# Patient Record
Sex: Male | Born: 1945 | Race: White | Hispanic: No | Smoking: Former smoker
Health system: Southern US, Community
[De-identification: ages and names within clinical notes are randomized; demographics above are authoritative.]

## PROBLEM LIST (undated history)

## (undated) DIAGNOSIS — K219 Gastro-esophageal reflux disease without esophagitis: Secondary | ICD-10-CM

## (undated) DIAGNOSIS — E785 Hyperlipidemia, unspecified: Secondary | ICD-10-CM

## (undated) DIAGNOSIS — I10 Essential (primary) hypertension: Secondary | ICD-10-CM

## (undated) DIAGNOSIS — E119 Type 2 diabetes mellitus without complications: Secondary | ICD-10-CM

## (undated) DIAGNOSIS — G4730 Sleep apnea, unspecified: Secondary | ICD-10-CM

## (undated) DIAGNOSIS — I639 Cerebral infarction, unspecified: Secondary | ICD-10-CM

## (undated) DIAGNOSIS — K5730 Diverticulosis of large intestine without perforation or abscess without bleeding: Secondary | ICD-10-CM

## (undated) DIAGNOSIS — K449 Diaphragmatic hernia without obstruction or gangrene: Secondary | ICD-10-CM

## (undated) DIAGNOSIS — D369 Benign neoplasm, unspecified site: Secondary | ICD-10-CM

## (undated) DIAGNOSIS — K649 Unspecified hemorrhoids: Secondary | ICD-10-CM

## (undated) HISTORY — DX: Diverticulosis of large intestine without perforation or abscess without bleeding: K57.30

## (undated) HISTORY — DX: Gastro-esophageal reflux disease without esophagitis: K21.9

## (undated) HISTORY — PX: EXPLORATORY LAPAROTOMY: SUR591

## (undated) HISTORY — DX: Diaphragmatic hernia without obstruction or gangrene: K44.9

## (undated) HISTORY — DX: Unspecified hemorrhoids: K64.9

## (undated) HISTORY — DX: Benign neoplasm, unspecified site: D36.9

## (undated) HISTORY — DX: Cerebral infarction, unspecified: I63.9

## (undated) HISTORY — DX: Hyperlipidemia, unspecified: E78.5

---

## 1976-06-06 HISTORY — PX: SPLENECTOMY, TOTAL: SHX788

## 1988-06-06 HISTORY — PX: CHOLECYSTECTOMY: SHX55

## 1996-06-06 DIAGNOSIS — G459 Transient cerebral ischemic attack, unspecified: Secondary | ICD-10-CM

## 1996-06-06 DIAGNOSIS — I639 Cerebral infarction, unspecified: Secondary | ICD-10-CM

## 1996-06-06 HISTORY — DX: Transient cerebral ischemic attack, unspecified: G45.9

## 1996-06-06 HISTORY — DX: Cerebral infarction, unspecified: I63.9

## 1997-10-11 ENCOUNTER — Observation Stay (HOSPITAL_COMMUNITY): Admission: EM | Admit: 1997-10-11 | Discharge: 1997-10-12 | Payer: Self-pay | Admitting: Emergency Medicine

## 2000-06-06 DIAGNOSIS — K449 Diaphragmatic hernia without obstruction or gangrene: Secondary | ICD-10-CM

## 2000-06-06 DIAGNOSIS — K5730 Diverticulosis of large intestine without perforation or abscess without bleeding: Secondary | ICD-10-CM

## 2000-06-06 DIAGNOSIS — D369 Benign neoplasm, unspecified site: Secondary | ICD-10-CM

## 2000-06-06 DIAGNOSIS — K649 Unspecified hemorrhoids: Secondary | ICD-10-CM

## 2000-06-06 HISTORY — DX: Diaphragmatic hernia without obstruction or gangrene: K44.9

## 2000-06-06 HISTORY — DX: Diverticulosis of large intestine without perforation or abscess without bleeding: K57.30

## 2000-06-06 HISTORY — DX: Benign neoplasm, unspecified site: D36.9

## 2000-06-06 HISTORY — DX: Unspecified hemorrhoids: K64.9

## 2001-01-09 ENCOUNTER — Encounter: Admission: RE | Admit: 2001-01-09 | Discharge: 2001-01-09 | Payer: Self-pay | Admitting: Gastroenterology

## 2001-01-09 ENCOUNTER — Encounter: Payer: Self-pay | Admitting: Gastroenterology

## 2001-01-23 ENCOUNTER — Ambulatory Visit (HOSPITAL_COMMUNITY): Admission: RE | Admit: 2001-01-23 | Discharge: 2001-01-23 | Payer: Self-pay | Admitting: Gastroenterology

## 2001-01-23 ENCOUNTER — Encounter (INDEPENDENT_AMBULATORY_CARE_PROVIDER_SITE_OTHER): Payer: Self-pay | Admitting: Specialist

## 2001-01-23 HISTORY — PX: ESOPHAGOGASTRODUODENOSCOPY: SHX1529

## 2001-01-23 HISTORY — PX: COLONOSCOPY: SHX174

## 2009-01-03 ENCOUNTER — Emergency Department (HOSPITAL_COMMUNITY): Admission: EM | Admit: 2009-01-03 | Discharge: 2009-01-03 | Payer: Self-pay | Admitting: Emergency Medicine

## 2009-01-26 ENCOUNTER — Ambulatory Visit (HOSPITAL_COMMUNITY): Admission: RE | Admit: 2009-01-26 | Discharge: 2009-01-26 | Payer: Self-pay | Admitting: Family Medicine

## 2010-10-22 NOTE — Procedures (Signed)
[place]. [facility]  Patient:    Andre [name], Andre [name] Visit Number: [id number] MRN: [id number]          Service Type: END Location: ENDO Attending Physician:  Nelda Marseille Proc. Date: 01/23/01 Adm. Date:  01/23/2001   CC:         Jamesetta Geralds, M.D.   Procedure Report  PROCEDURE PERFORMED:  Esophagogastroduodenoscopy with biopsies.  ENDOSCOPIST:  Petra Kuba, M.D.  INDICATIONS FOR PROCEDURE:  Atypical chest and abdominal pain, want to rule out upper track source.  Consent was signed after risks, benefits, methods, and options were thoroughly discussed in the office.  MEDICATIONS USED:  Demerol 80 mg, Versed 8 mg.  DESCRIPTION OF PROCEDURE:  Scope was inserted by direct vision.  The proximal and midesophagus was normal.  In the distal esophagus was a widely patent ring and a small to medium-size hiatal hernia.  Scope passed into the stomach and advanced through a normal antrum, normal pylorus into a normal duodenal bulb and around the C-loop to a normal second portion of the duodenum.  We probably advanced to the third part of the duodenum as well.  No duodenal abnormalities were seen.  The scope was slowly withdrawn back to the bulb again which looked normal.  Scope was withdrawn back to the stomach and retroflexed.  Angularis, cardia and fundus were normal except for mild to moderate gastritis along the lesser and greater curve and the hiatal hernia being confirmed in the cardia. The scope was straightened and straight visualization of the stomach confirmed a mild to moderate gastritis, ruled out any other abnormalities.  Scope was then advanced to the antrum and two biopsies of the antrum and a few of the proximal stomach were obtained to rule out Helicobacter pylori, confirming the gastritis.  Air was suctioned, the scope slowly withdrawn, again a good look at the esophagus was normal except for the hiatal hernia and a widely patent ring.   Scope was removed.  The patient tolerated the procedure well.  There was no obvious immediate complication.  ENDOSCOPIC DIAGNOSIS: 1. Moderate hiatal hernia with a widely patent ring. 2. Mild to moderate gastritis status post biopsy. 3. Otherwise normal esophagogastroduodenoscopy.  PLAN:  Would use pump inhibitors p.r.n. or if using daily aspirin or nonsteroidals and continue work-up with a colonoscopy.  Please see that dictation for other work-up plans and details. Attending Physician:  Nelda Marseille DD:  01/23/01 TD:  01/23/01 Job: [id number]

## 2010-10-22 NOTE — Op Note (Signed)
[place]. [facility]  Patient:    Andre [name], Andre [name] Visit Number: [id number] MRN: [id number]          Service Type: END Location: ENDO Attending Physician:  Nelda Marseille Proc. Date: 01/23/01 Adm. Date:  01/23/2001   CC:         Jamesetta Geralds, M.D.   Operative Report  PROCEDURE:  Colonoscopy with polypectomy.  ENDOSCOPIST:  Petra Kuba, M.D.  INDICATIONS:  Patient with history of colon polyps overdue for colonic screening, also with multiple GI complaints and abnormal CT scan with a question about the transverse.  Consent was signed after risks, benefits, methods, and options were thoroughly discussed prior to any premedications given.  No additional medicines were used for this procedure.  DESCRIPTION OF PROCEDURE:  Rectal inspection was pertinent for small external hemorrhoids.  Digital exam was negative.  Video colonoscope was inserted and easily advanced around the colon to the cecum which was identified by the appendiceal orifice and the ileocecal valve.  No obvious abnormality was seen on insertion.  The scope was inserted a short ways into the terminal ileum which was normal.  Photo documentation was obtained.  Prep was adequate. There was a moderate amount liquid stool, particularly on the right side and left side of the colon which required lots of washing and suctioning.  On slow withdrawal through the colon, the cecum, ascending, and transverse were normal.  As the scope was withdrawn around the left side of the colon, an occasional diverticula was seen.  Once back in the rectum, a small polyp was seen which was hot biopsied x 2.  No other polypoid lesions or masses were seen.  We then went ahead and retroflexed revealing some small external hemorrhoids.  The scope was then straightened and readvanced a short ways up the left side of the colon.  Air was suctioned and scope removed.  The patient tolerated the procedure well.  There was  no obvious immediate complication.  ENDOSCOPIC DIAGNOSIS: 1. Small internal and external hemorrhoids. 2. Left-sided mild diverticula. 3. Normal transverse without signs of inflammation. 4. Small rectal polyp hot biopsied. 4. Otherwise within normal limits to the terminal ileum.  PLAN:  Await pathology but probably recheck colonic screening in five years. GI followup p.r.n. or in two months but would return care to Dr. Tery Sanfilippo for further scapular pain workup to include possible chest x-ray, bone x-rays, or even a bone scan.  In one week, it would be reasonable to try Celebrex or Vioxx.  Happy to see back sooner p.r.n. Attending Physician:  Nelda Marseille DD:  01/23/01 TD:  01/23/01 Job: [id number]

## 2011-09-20 ENCOUNTER — Encounter: Payer: Self-pay | Admitting: Family Medicine

## 2011-09-20 ENCOUNTER — Ambulatory Visit (INDEPENDENT_AMBULATORY_CARE_PROVIDER_SITE_OTHER): Payer: Medicare Other | Admitting: Family Medicine

## 2011-09-20 VITALS — BP 120/80 | HR 97 | Resp 15 | Ht 72.5 in | Wt 188.0 lb

## 2011-09-20 DIAGNOSIS — J449 Chronic obstructive pulmonary disease, unspecified: Secondary | ICD-10-CM

## 2011-09-20 DIAGNOSIS — F17200 Nicotine dependence, unspecified, uncomplicated: Secondary | ICD-10-CM

## 2011-09-20 DIAGNOSIS — Z130 Encounter for screening for diseases of the blood and blood-forming organs and certain disorders involving the immune mechanism: Secondary | ICD-10-CM

## 2011-09-20 DIAGNOSIS — Z125 Encounter for screening for malignant neoplasm of prostate: Secondary | ICD-10-CM

## 2011-09-20 DIAGNOSIS — Z720 Tobacco use: Secondary | ICD-10-CM

## 2011-09-20 DIAGNOSIS — K635 Polyp of colon: Secondary | ICD-10-CM

## 2011-09-20 DIAGNOSIS — Z8673 Personal history of transient ischemic attack (TIA), and cerebral infarction without residual deficits: Secondary | ICD-10-CM

## 2011-09-20 DIAGNOSIS — G4700 Insomnia, unspecified: Secondary | ICD-10-CM

## 2011-09-20 DIAGNOSIS — D126 Benign neoplasm of colon, unspecified: Secondary | ICD-10-CM

## 2011-09-20 DIAGNOSIS — Z1321 Encounter for screening for nutritional disorder: Secondary | ICD-10-CM

## 2011-09-20 NOTE — Patient Instructions (Signed)
Melatonin is over the counter for sleep  Schedule a physical in 6 weeks Get the blood work done fasting at least 48 hours before

## 2011-09-20 NOTE — Progress Notes (Signed)
  Subjective:    Patient ID: Andre [name], male    DOB: June 17, 1945, 66 y.o.   MRN: [id number]  HPI Pt here to establish care , no PCP in approx 20 years Medications and History reviewed Insomnia- difficulty sleeping for past few years, gets in bed at 9pm but unable to fall asleep until 1am. No caffeine or TV before bedtime  History of Mini Stroke - history of TIA x 2, last in 2008/2009 he is on full dose aspirin, no history of coronary artery disease. He has no deficits.   COPD- noted in his chart. He was on inhalers in the 90s he states. He quit smoking a few months ago. But continues to carry a pack around  He is a [place] [employer]. He currently works at [employer] part-time Review of Systems   GEN- denies fatigue, fever, weight loss,weakness,+ recent illness (had GI symptomsresolved) HEENT- denies eye drainage, change in vision, nasal discharge, CVS- denies chest pain, palpitations RESP- denies SOB, cough, occ wheeze ABD- denies N/V, change in stools, abd pain GU- denies dysuria, hematuria, dribbling, incontinence MSK- denies joint pain, muscle aches, injury Neuro- denies headache, dizziness, syncope, seizure activity      Objective:   Physical Exam GEN- NAD, alert and oriented x3 HEENT- PERRL, EOMI, non injected sclera, pink conjunctiva, MMM, oropharynx clear Neck- Supple, no bruit CVS- RRR, no murmur RESP-CTAB ABD-NABS,soft, NT,ND EXT- No edema Pulses- Radial, DP- 2+         Assessment & Plan:

## 2011-09-21 ENCOUNTER — Encounter: Payer: Self-pay | Admitting: Family Medicine

## 2011-09-21 DIAGNOSIS — Z720 Tobacco use: Secondary | ICD-10-CM | POA: Insufficient documentation

## 2011-09-21 NOTE — Assessment & Plan Note (Signed)
Patient does not want prescription medication. Will give a trial of melatonin over-the-counter

## 2011-09-21 NOTE — Assessment & Plan Note (Signed)
He will need pulmonary function tests at some point. At this point his breathing has been stable. No medications

## 2011-09-21 NOTE — Assessment & Plan Note (Signed)
Check baseline labs including fasting lipid panel. Continue full dose aspirin.

## 2011-09-21 NOTE — Assessment & Plan Note (Signed)
Polyp noted in 2002. He will need to go back for another colonoscopy we will discuss this at his physical exam

## 2011-11-01 LAB — CBC WITH DIFFERENTIAL/PLATELET
Basophils Absolute: 0 10*3/uL (ref 0.0–0.1)
Basophils Relative: 0 % (ref 0–1)
Eosinophils Absolute: 0.1 10*3/uL (ref 0.0–0.7)
Eosinophils Relative: 1 % (ref 0–5)
HCT: 44.7 % (ref 39.0–52.0)
Hemoglobin: 15.7 g/dL (ref 13.0–17.0)
Lymphocytes Relative: 28 % (ref 12–46)
Lymphs Abs: 3.7 10*3/uL (ref 0.7–4.0)
MCH: 32.4 pg (ref 26.0–34.0)
MCHC: 35.1 g/dL (ref 30.0–36.0)
MCV: 92.2 fL (ref 78.0–100.0)
Monocytes Absolute: 1.3 10*3/uL — ABNORMAL HIGH (ref 0.1–1.0)
Monocytes Relative: 10 % (ref 3–12)
Neutro Abs: 8 10*3/uL — ABNORMAL HIGH (ref 1.7–7.7)
Neutrophils Relative %: 61 % (ref 43–77)
Platelets: 356 10*3/uL (ref 150–400)
RBC: 4.85 MIL/uL (ref 4.22–5.81)
RDW: 14.2 % (ref 11.5–15.5)
WBC: 13.2 10*3/uL — ABNORMAL HIGH (ref 4.0–10.5)

## 2011-11-01 LAB — LIPID PANEL
Cholesterol: 260 mg/dL — ABNORMAL HIGH (ref 0–200)
HDL: 35 mg/dL — ABNORMAL LOW (ref >39)
LDL Cholesterol: 178 mg/dL — ABNORMAL HIGH (ref 0–99)
Total CHOL/HDL Ratio: 7.4 Ratio
Triglycerides: 235 mg/dL — ABNORMAL HIGH (ref <150)
VLDL: 47 mg/dL — ABNORMAL HIGH (ref 0–40)

## 2011-11-01 LAB — COMPREHENSIVE METABOLIC PANEL
ALT: 28 U/L (ref 0–53)
AST: 25 U/L (ref 0–37)
Albumin: 4.3 g/dL (ref 3.5–5.2)
Alkaline Phosphatase: 55 U/L (ref 39–117)
BUN: 18 mg/dL (ref 6–23)
CO2: 24 mEq/L (ref 19–32)
Calcium: 9.2 mg/dL (ref 8.4–10.5)
Chloride: 105 mEq/L (ref 96–112)
Creat: 1 mg/dL (ref 0.50–1.35)
Glucose, Bld: 96 mg/dL (ref 70–99)
Potassium: 4.5 mEq/L (ref 3.5–5.3)
Sodium: 140 mEq/L (ref 135–145)
Total Bilirubin: 0.4 mg/dL (ref 0.3–1.2)
Total Protein: 7 g/dL (ref 6.0–8.3)

## 2011-11-03 LAB — VITAMIN D 1,25 DIHYDROXY: Vitamin D3 1, 25 (OH)2: 56 pg/mL

## 2011-11-04 ENCOUNTER — Ambulatory Visit (INDEPENDENT_AMBULATORY_CARE_PROVIDER_SITE_OTHER): Payer: Medicare Other | Admitting: Family Medicine

## 2011-11-04 ENCOUNTER — Encounter: Payer: Self-pay | Admitting: Family Medicine

## 2011-11-04 VITALS — BP 120/74 | HR 95 | Resp 16 | Ht 72.5 in | Wt 189.4 lb

## 2011-11-04 DIAGNOSIS — J449 Chronic obstructive pulmonary disease, unspecified: Secondary | ICD-10-CM

## 2011-11-04 DIAGNOSIS — F17200 Nicotine dependence, unspecified, uncomplicated: Secondary | ICD-10-CM

## 2011-11-04 DIAGNOSIS — D126 Benign neoplasm of colon, unspecified: Secondary | ICD-10-CM

## 2011-11-04 DIAGNOSIS — G4700 Insomnia, unspecified: Secondary | ICD-10-CM

## 2011-11-04 DIAGNOSIS — Z23 Encounter for immunization: Secondary | ICD-10-CM

## 2011-11-04 DIAGNOSIS — R0602 Shortness of breath: Secondary | ICD-10-CM

## 2011-11-04 DIAGNOSIS — Z720 Tobacco use: Secondary | ICD-10-CM

## 2011-11-04 DIAGNOSIS — J4489 Other specified chronic obstructive pulmonary disease: Secondary | ICD-10-CM

## 2011-11-04 DIAGNOSIS — H547 Unspecified visual loss: Secondary | ICD-10-CM

## 2011-11-04 DIAGNOSIS — K635 Polyp of colon: Secondary | ICD-10-CM

## 2011-11-04 DIAGNOSIS — Z0000 Encounter for general adult medical examination without abnormal findings: Secondary | ICD-10-CM

## 2011-11-04 DIAGNOSIS — L609 Nail disorder, unspecified: Secondary | ICD-10-CM

## 2011-11-04 DIAGNOSIS — E785 Hyperlipidemia, unspecified: Secondary | ICD-10-CM

## 2011-11-04 MED ORDER — ATORVASTATIN CALCIUM 20 MG PO TABS: 20.0000 mg | ORAL_TABLET | Freq: Every day | ORAL | Status: DC

## 2011-11-04 NOTE — Assessment & Plan Note (Signed)
His symptoms are mostly at night I think that this is either due to his COPD or some sleep apnea. I will obtain PFTs and send him for a sleep study.

## 2011-11-04 NOTE — Assessment & Plan Note (Signed)
TDAP, pneumovax given

## 2011-11-04 NOTE — Assessment & Plan Note (Signed)
Pt to schedule his own eye visit

## 2011-11-04 NOTE — Assessment & Plan Note (Signed)
Start lipitor

## 2011-11-04 NOTE — Assessment & Plan Note (Signed)
PFT to be done 

## 2011-11-04 NOTE — Progress Notes (Addendum)
  Subjective:    Patient ID: Andre [name], male    DOB: [birth year], 66 y.o.   MRN: [id number]  HPI  Patient here for complete physical exam. Medications and history reviewed. Labs reviewed. He continues to have problems sleeping. The melatonin did not help. He does not want any other meds at this time. At nighttime he has problems breathing. He wakes up short of breath. Typically dozes back off after is in a recliner. He is unaware if he stops breathing in his sleep as he sleeps in a different room from his wife who has a hospital bed Due for colonoscopy Due for immunizations   Review of Systems  GEN- denies fatigue, fever, weight loss,weakness, recent illness HEENT- denies eye drainage, change in vision, nasal discharge, CVS- denies chest pain, palpitations RESP- denies SOB, cough, wheeze ABD- denies N/V, change in stools, abd pain GU- denies dysuria, hematuria, dribbling, incontinence MSK- denies joint pain, muscle aches, injury Neuro- denies headache, dizziness, syncope, seizure activity       Objective:   Physical Exam GEN- NAD, alert and oriented x3 HEENT- PERRL, EOMI, non injected sclera, pink conjunctiva, MMM, oropharynx clear Neck- Supple, no bruit CVS- RRR, no murmur RESP-CTAB ABD-NABS,soft, NT,ND EXT- No edema, very thickened enlongated nails, callus buildup bilat Pulses- Radial, DP- 2+ Neuro- no focal deficits GU-normal rectal tone, prostate smooth, no nodules, FOBT-negative   EKG-NRS       Assessment & Plan:

## 2011-11-04 NOTE — Patient Instructions (Signed)
I recommend Eye doctor visit- please call and schedule I recommend dental visit  I will refer you to Dr. Ewing Schlein  I will set up the breathing test and sleeping test Start the cholesterol medication at bedtime F/U 6 months

## 2011-11-04 NOTE — Assessment & Plan Note (Signed)
counseled on tobacco cessation 

## 2011-11-04 NOTE — Assessment & Plan Note (Signed)
Hold on medications

## 2011-11-04 NOTE — Progress Notes (Signed)
Addended by: Milinda Antis F on: 11/04/2011 09:44 PM   Modules accepted: Orders

## 2011-11-07 ENCOUNTER — Other Ambulatory Visit: Payer: Self-pay

## 2011-11-07 MED ORDER — ATORVASTATIN CALCIUM 20 MG PO TABS: 20.0000 mg | ORAL_TABLET | Freq: Every day | ORAL | Status: DC

## 2011-11-14 ENCOUNTER — Encounter: Payer: Self-pay | Admitting: Family Medicine

## 2011-11-17 ENCOUNTER — Telehealth: Payer: Self-pay | Admitting: Family Medicine

## 2011-11-22 ENCOUNTER — Ambulatory Visit (HOSPITAL_COMMUNITY)
Admission: RE | Admit: 2011-11-22 | Discharge: 2011-11-22 | Disposition: A | Discharge status: Home / Self Care | Payer: Medicare Other | Source: Ambulatory Visit | Attending: Family Medicine | Admitting: Family Medicine

## 2011-11-22 DIAGNOSIS — J4489 Other specified chronic obstructive pulmonary disease: Secondary | ICD-10-CM | POA: Insufficient documentation

## 2011-11-22 DIAGNOSIS — J449 Chronic obstructive pulmonary disease, unspecified: Secondary | ICD-10-CM

## 2011-11-22 DIAGNOSIS — R0609 Other forms of dyspnea: Secondary | ICD-10-CM | POA: Insufficient documentation

## 2011-11-22 DIAGNOSIS — R0989 Other specified symptoms and signs involving the circulatory and respiratory systems: Secondary | ICD-10-CM | POA: Insufficient documentation

## 2011-11-22 MED ORDER — ALBUTEROL SULFATE (5 MG/ML) 0.5% IN NEBU: 2.5000 mg | INHALATION_SOLUTION | Freq: Once | RESPIRATORY_TRACT | Status: DC

## 2011-11-29 NOTE — Procedures (Signed)
NAME:  Andre [name], Andre [name]                ACCOUNT NO.:  [id number]  MEDICAL RECORD NO.:  [id number]  LOCATION:  RESP                          FACILITY:  [facility]  PHYSICIAN:  Jarl Sellitto L. Juanetta Gosling, M.D.DATE OF BIRTH:  06/13/1945  DATE OF PROCEDURE: DATE OF DISCHARGE:  11/22/2011                           PULMONARY FUNCTION TEST   Reason for pulmonary function testing is COPD. 1. Spirometry shows no ventilatory defect and minimal if any airflow     obstruction, most marked in the smaller airways. 2. Lung volumes are normal. 3. DLCO is moderately reduced, but does correct when volume is taken     to account. 4. Airway resistance is essentially normal. 5. There is no significant bronchodilator improvement.     Davyd Podgorski L. Juanetta Gosling, M.D.     ELH/MEDQ  D:  11/29/2011  T:  11/29/2011  Job:  [id number]  cc:   Milinda Antis, MD

## 2011-11-30 NOTE — Progress Notes (Signed)
Fairly normal PFT, no evidence of obstruction

## 2011-12-01 LAB — PULMONARY FUNCTION TEST

## 2011-12-02 ENCOUNTER — Encounter: Payer: Self-pay | Admitting: Urgent Care

## 2011-12-02 ENCOUNTER — Ambulatory Visit (INDEPENDENT_AMBULATORY_CARE_PROVIDER_SITE_OTHER): Payer: Medicare Other | Admitting: Urgent Care

## 2011-12-02 ENCOUNTER — Other Ambulatory Visit: Payer: Self-pay | Admitting: Internal Medicine

## 2011-12-02 VITALS — BP 147/88 | HR 80 | Temp 98.0°F | Ht 74.0 in | Wt 189.4 lb

## 2011-12-02 DIAGNOSIS — Z860101 Personal history of adenomatous and serrated colon polyps: Secondary | ICD-10-CM | POA: Insufficient documentation

## 2011-12-02 DIAGNOSIS — Z1211 Encounter for screening for malignant neoplasm of colon: Secondary | ICD-10-CM

## 2011-12-02 DIAGNOSIS — Z86010 Personal history of colonic polyps: Secondary | ICD-10-CM

## 2011-12-02 MED ORDER — PEG 3350-KCL-NA BICARB-NACL 420 G PO SOLR: ORAL | Status: AC

## 2011-12-02 NOTE — Progress Notes (Signed)
Referring Provider: [place], [place] F, MD Primary Care Physician:  [place], KAWANTA, MD Primary Gastroenterologist:  Dr. Rourk  Chief Complaint  Patient presents with  . Colonoscopy    hx of polyps   HPI:  Andre [name] is a 66 y.o. male here as a referral from Dr.  for colonoscopy. He has history of adenomatous rectal polyp removed in 2002 by Dr. Magod.  He has not had a colonoscopy since that time. Denies any lower GI symptoms including constipation, diarrhea, rectal bleeding, melena or weight loss. History of chronic GERD well controlled on Prilosec 20 mg daily. Denies nausea, vomiting, dysphagia, odynophagia or anorexia. Past Medical History  Diagnosis Date  . Mini stroke 1998  . Hyperlipidemia   . Hiatal hernia 2002    moderate  . Adenomatous polyp 2002    tcs by Dr. Magod  . Hemorrhoids 2002    internal and external  . Diverticula of colon 2002    L side  . GERD (gastroesophageal reflux disease)     Past Surgical History  Procedure Date  . 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 Esophagogastroduodenoscopy 01/23/01    Dr. Magod-moderate hiatal hernia, mild to moderate gastritis  . Colonoscopy 01/23/01    Dr. Magod- small internal and external hemorrhoids, L sided mild diverticula. adenomatous polyp removed from rectum.  . Cholecystectomy 1990    Dr Weatherly    Current Outpatient Prescriptions  Medication Sig Dispense Refill  . aspirin 325 MG tablet Take 325 mg by mouth daily.      . atorvastatin (LIPITOR) 20 MG tablet Take 1 tablet (20 mg total) by mouth daily.  30 tablet  3  . omeprazole (PRILOSEC) 20 MG capsule Take 20 mg by mouth daily.        Allergies as of 12/02/2011  . (No Known Allergies)    Family History:There is no known family history of colorectal carcinoma , liver disease, or inflammatory bowel disease.  Problem Relation Age of Onset  . Hyperlipidemia Mother   . Heart disease Mother   . Hypertension Mother   . Stroke Mother   . Heart  disease Father     MI    History   Social History  . Marital Status: Married    Spouse Name: N/A    Number of Children: 2  . Years of Education: N/A   Occupational History  . retired [employer], PT [employer]   Social History Main Topics  . Smoking status: Current Everyday Smoker -- 0.5 packs/day for 50 years    Types: Cigarettes  . Smokeless tobacco: Never Used   Comment: smokes about 4 cigarettes daily  . Alcohol Use: Yes     2 beers daily x 40 yrs-rarely more  . Drug Use: No  . Sexually Active: Not on file  Review of Systems: Gen: Denies any fever, chills, sweats, anorexia, fatigue, weakness, malaise, weight loss, and sleep disorder CV: Denies chest pain, angina, palpitations, syncope, orthopnea, PND, peripheral edema, and claudication. Resp: Denies dyspnea at rest, dyspnea with exercise, cough, sputum, wheezing, coughing up blood, and pleurisy. GI: Denies vomiting blood, jaundice, and fecal incontinence.   Denies dysphagia or odynophagia. GU : Denies urinary burning, blood in urine, urinary frequency, urinary hesitancy, nocturnal urination, and urinary incontinence. MS: Denies joint pain, limitation of movement, and swelling, stiffness, low back pain, extremity pain. Denies muscle weakness, cramps, atrophy.  Derm: Denies rash, itching, dry skin, hives, moles, warts, or unhealing ulcers.  Psych: Denies   depression, anxiety, memory loss, suicidal ideation, hallucinations, paranoia, and confusion. Heme: Denies bruising, bleeding, and enlarged lymph nodes. Neuro:  Denies any headaches, dizziness, paresthesias. Endo:  Denies any problems with DM, thyroid, adrenal function.  Physical Exam: BP 147/88  Pulse 80  Temp 98 F (36.7 C) (Tympanic)  Ht 6' 2" (1.88 m)  Wt 189 lb 6.4 oz (85.911 kg)  BMI 24.32 kg/m2 General:   Alert,  Well-developed, well-nourished, pleasant and cooperative in NAD Head:  Normocephalic and atraumatic. Eyes:  Sclera clear, no icterus.    Conjunctiva pink. Ears:  Normal auditory acuity. Nose:  No deformity, discharge, or lesions. Mouth:  No deformity or lesions,oropharynx pink & moist. Neck:  Supple; no masses or thyromegaly. Lungs:  Clear throughout to auscultation.   No wheezes, crackles, or rhonchi. No acute distress. Heart:  Regular rate and rhythm; no murmurs, clicks, rubs,  or gallops. Abdomen:  Normal bowel sounds.  No bruits.  Soft, non-tender and non-distended without masses, hepatosplenomegaly or hernias noted.  No guarding or rebound tenderness.   Rectal:  Deferred. Msk:  Symmetrical without gross deformities. Normal posture. Pulses:  Normal pulses noted. Extremities:  No clubbing or edema. Neurologic:  Alert and oriented x4;  grossly normal neurologically. Skin:  Intact without significant lesions or rashes. Lymph Nodes:  No significant cervical adenopathy. Psych:  Alert and cooperative. Normal mood and affect.  

## 2011-12-02 NOTE — Assessment & Plan Note (Addendum)
Andre [name] is a pleasant 66 y.o. male with single adenomatous rectal polyp removed from colonoscopy 2002. He is due for surveillance colonoscopy. Anoscopy with Dr. Jena Gauss in near future. He denies any GI complaints at this time.  I have discussed risks & benefits which include, but are not limited to, bleeding, infection, perforation & drug reaction.  The patient agrees with this plan & written consent will be obtained.

## 2011-12-02 NOTE — Progress Notes (Signed)
Faxed to PCP

## 2011-12-02 NOTE — Patient Instructions (Addendum)
Colonoscopy w/ Dr Jena Gauss

## 2011-12-14 ENCOUNTER — Encounter (HOSPITAL_COMMUNITY): Payer: Self-pay | Admitting: Pharmacy Technician

## 2011-12-26 ENCOUNTER — Encounter (HOSPITAL_COMMUNITY)
Admission: RE | Disposition: A | Discharge status: Home / Self Care | Payer: Self-pay | Source: Ambulatory Visit | Attending: Internal Medicine

## 2011-12-26 ENCOUNTER — Encounter (HOSPITAL_COMMUNITY): Payer: Self-pay | Admitting: *Deleted

## 2011-12-26 ENCOUNTER — Ambulatory Visit (HOSPITAL_COMMUNITY)
Admission: RE | Admit: 2011-12-26 | Discharge: 2011-12-26 | Disposition: A | Discharge status: Home / Self Care | Payer: Medicare Other | Source: Ambulatory Visit | Attending: Internal Medicine | Admitting: Internal Medicine

## 2011-12-26 DIAGNOSIS — K5730 Diverticulosis of large intestine without perforation or abscess without bleeding: Secondary | ICD-10-CM | POA: Insufficient documentation

## 2011-12-26 DIAGNOSIS — Z860100 Personal history of colon polyps, unspecified: Secondary | ICD-10-CM | POA: Insufficient documentation

## 2011-12-26 DIAGNOSIS — Z1211 Encounter for screening for malignant neoplasm of colon: Secondary | ICD-10-CM

## 2011-12-26 DIAGNOSIS — D126 Benign neoplasm of colon, unspecified: Secondary | ICD-10-CM

## 2011-12-26 DIAGNOSIS — Z09 Encounter for follow-up examination after completed treatment for conditions other than malignant neoplasm: Secondary | ICD-10-CM | POA: Insufficient documentation

## 2011-12-26 DIAGNOSIS — E785 Hyperlipidemia, unspecified: Secondary | ICD-10-CM | POA: Insufficient documentation

## 2011-12-26 HISTORY — PX: COLONOSCOPY: SHX5424

## 2011-12-26 SURGERY — COLONOSCOPY
Anesthesia: Moderate Sedation

## 2011-12-26 MED ORDER — MIDAZOLAM HCL 5 MG/5ML IJ SOLN
INTRAMUSCULAR | Status: DC | PRN
  Administered 2011-12-26: 2 mg via INTRAVENOUS
  Administered 2011-12-26: 1 mg via INTRAVENOUS

## 2011-12-26 MED ORDER — STERILE WATER FOR IRRIGATION IR SOLN
Status: DC | PRN
  Administered 2011-12-26: 09:00:00

## 2011-12-26 MED ORDER — MIDAZOLAM HCL 5 MG/5ML IJ SOLN
INTRAMUSCULAR | Status: AC
  Filled 2011-12-26: qty 10

## 2011-12-26 MED ORDER — MEPERIDINE HCL 100 MG/ML IJ SOLN
INTRAMUSCULAR | Status: DC | PRN
  Administered 2011-12-26: 50 mg via INTRAVENOUS

## 2011-12-26 MED ORDER — SODIUM CHLORIDE 0.45 % IV SOLN
Freq: Once | INTRAVENOUS | Status: AC
  Administered 2011-12-26: 08:00:00 via INTRAVENOUS

## 2011-12-26 MED ORDER — MEPERIDINE HCL 100 MG/ML IJ SOLN
INTRAMUSCULAR | Status: AC
  Filled 2011-12-26: qty 2

## 2011-12-26 NOTE — Op Note (Signed)
[facility] [address] [place], [place]  [address]  COLONOSCOPY PROCEDURE REPORT  PATIENT:  Andre [name], Andre [name]  MR#:  [id number] BIRTHDATE:  09/20/1945, 66 yrs. old  GENDER:  male ENDOSCOPIST:  R. Roetta Sessions, MD FACP [facility] REF. BY:  Milinda Antis, M.D. PROCEDURE DATE:  12/26/2011 PROCEDURE:        colonoscopy with biopsy and snare polypectomy  INDICATIONS:  Surveillance examination; history of colonic polyps  INFORMED CONSENT:  The risks, benefits, alternatives and imponderables including but not limited to bleeding, perforation as well as the possibility of a missed lesion have been reviewed. The potential for biopsy, lesion removal, etc. have also been discussed.  Questions have been answered.  All parties agreeable. Please see the history and physical in the medical record for more information.  MEDICATIONS:  Versed 3 mg IV and Demerol 50 mg in divided doses  DESCRIPTION OF PROCEDURE:  After a digital rectal exam was performed, the EC-[id number] ([id number]) colonoscope was advanced from the anus through the rectum and colon to the area of the cecum, ileocecal valve and appendiceal orifice.  The cecum was deeply intubated.  These structures were well-seen and photographed for the record.  From the level of the cecum and ileocecal valve, the scope was slowly and cautiously withdrawn.  The mucosal surfaces were carefully surveyed utilizing scope tip deflection to facilitate fold flattening as needed.  The scope was pulled down into the rectum where a thorough examination including retroflexion was performed. <<PROCEDUREIMAGES>>  FINDINGS:  Adequate preparation. Internal hemorrhoids and anal papilla. Pancolonic diverticula (left sided greater than right). Single diminutive cecal polyp and (2) 4 mm polyps in the mid ascending segment; the remainder of the colonic mucosa appeared normal. There were a couple of scattered erosions in the distal 5 cm of terminal ileal  mucosa.  THERAPEUTIC / DIAGNOSTIC MANEUVERS PERFORMED:   the polyps mentioned above were cold biopsied or snare removed  COMPLICATIONS:  none  CECAL WITHDRAWAL TIME: 9 minutes  IMPRESSION:  Colonic polyps-removed as described above. Pancolonic diverticulosis.  Ileal erosions of uncertain significance.  RECOMMENDATIONS:  Followup on biopsy  ______________________________ R. Roetta Sessions, MD Caleen Essex  CC:  Milinda Antis, M.D.  n. eSIGNED:   R. Roetta Sessions at 12/26/2011 09:48 AM  Edgardo Roys, [id number]

## 2011-12-26 NOTE — H&P (View-Only) (Signed)
Referring Provider: Salley Scarlet, MD Primary Care Physician:  Milinda Antis, MD Primary Gastroenterologist:  Dr. Jena Gauss  Chief Complaint  Patient presents with  . Colonoscopy    hx of polyps   HPI:  Andre [name] is a 66 y.o. male here as a referral from Dr. Jeanice Lim for colonoscopy. He has history of adenomatous rectal polyp removed in 2002 by Dr. Ewing Schlein.  He has not had a colonoscopy since that time. Denies any lower GI symptoms including constipation, diarrhea, rectal bleeding, melena or weight loss. History of chronic GERD well controlled on Prilosec 20 mg daily. Denies nausea, vomiting, dysphagia, odynophagia or anorexia. Past Medical History  Diagnosis Date  . Mini stroke 1998  . Hyperlipidemia   . Hiatal hernia 2002    moderate  . Adenomatous polyp 2002    tcs by Dr. Ewing Schlein  . Hemorrhoids 2002    internal and external  . Diverticula of colon 2002    L side  . GERD (gastroesophageal reflux disease)     Past Surgical History  Procedure Date  . 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 Esophagogastroduodenoscopy 01/23/01    Dr. Delma Officer hiatal hernia, mild to moderate gastritis  . Colonoscopy 01/23/01    Dr. Ewing Schlein- small internal and external hemorrhoids, L sided mild diverticula. adenomatous polyp removed from rectum.  . Cholecystectomy 1990    Dr Zachery Dakins    Current Outpatient Prescriptions  Medication Sig Dispense Refill  . aspirin 325 MG tablet Take 325 mg by mouth daily.      Marland Kitchen atorvastatin (LIPITOR) 20 MG tablet Take 1 tablet (20 mg total) by mouth daily.  30 tablet  3  . omeprazole (PRILOSEC) 20 MG capsule Take 20 mg by mouth daily.        Allergies as of 12/02/2011  . (No Known Allergies)    Family History:There is no known family history of colorectal carcinoma , liver disease, or inflammatory bowel disease.  Problem Relation Age of Onset  . Hyperlipidemia Mother   . Heart disease Mother   . Hypertension Mother   . Stroke Mother   . Heart  disease Father     MI    History   Social History  . Marital Status: Married    Spouse Name: N/A    Number of Children: 2  . Years of Education: N/A   Occupational History  . 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 Smoking status: Current Everyday Smoker -- 0.5 packs/day for 50 years    Types: Cigarettes  . Smokeless tobacco: Never Used   Comment: smokes about 4 cigarettes daily  . Alcohol Use: Yes     2 beers daily x 40 yrs-rarely more  . Drug Use: No  . Sexually Active: Not on file  Review of Systems: Gen: Denies any fever, chills, sweats, anorexia, fatigue, weakness, malaise, weight loss, and sleep disorder CV: Denies chest pain, angina, palpitations, syncope, orthopnea, PND, peripheral edema, and claudication. Resp: Denies dyspnea at rest, dyspnea with exercise, cough, sputum, wheezing, coughing up blood, and pleurisy. GI: Denies vomiting blood, jaundice, and fecal incontinence.   Denies dysphagia or odynophagia. GU : Denies urinary burning, blood in urine, urinary frequency, urinary hesitancy, nocturnal urination, and urinary incontinence. MS: Denies joint pain, limitation of movement, and swelling, stiffness, low back pain, extremity pain. Denies muscle weakness, cramps, atrophy.  Derm: Denies rash, itching, dry skin, hives, moles, warts, or unhealing ulcers.  Psych: Denies  depression, anxiety, memory loss, suicidal ideation, hallucinations, paranoia, and confusion. Heme: Denies bruising, bleeding, and enlarged lymph nodes. Neuro:  Denies any headaches, dizziness, paresthesias. Endo:  Denies any problems with DM, thyroid, adrenal function.  Physical Exam: BP 147/88  Pulse 80  Temp 98 F (36.7 C) (Tympanic)  Ht 6\' 2"  (1.88 m)  Wt 189 lb 6.4 oz (85.911 kg)  BMI 24.32 kg/m2 General:   Alert,  Well-developed, well-nourished, pleasant and cooperative in NAD Head:  Normocephalic and atraumatic. Eyes:  Sclera clear, no icterus.    Conjunctiva pink. Ears:  Normal auditory acuity. Nose:  No deformity, discharge, or lesions. Mouth:  No deformity or lesions,oropharynx pink & moist. Neck:  Supple; no masses or thyromegaly. Lungs:  Clear throughout to auscultation.   No wheezes, crackles, or rhonchi. No acute distress. Heart:  Regular rate and rhythm; no murmurs, clicks, rubs,  or gallops. Abdomen:  Normal bowel sounds.  No bruits.  Soft, non-tender and non-distended without masses, hepatosplenomegaly or hernias noted.  No guarding or rebound tenderness.   Rectal:  Deferred. Msk:  Symmetrical without gross deformities. Normal posture. Pulses:  Normal pulses noted. Extremities:  No clubbing or edema. Neurologic:  Alert and oriented x4;  grossly normal neurologically. Skin:  Intact without significant lesions or rashes. Lymph Nodes:  No significant cervical adenopathy. Psych:  Alert and cooperative. Normal mood and affect.

## 2011-12-26 NOTE — Interval H&P Note (Signed)
History and Physical Interval Note:  12/26/2011 9:18 AM  Andre [name]  has presented today for surgery, with the diagnosis of History of adenomatous colon polyps  The various methods of treatment have been discussed with the patient and family. After consideration of risks, benefits and other options for treatment, the patient has consented to  Procedure(s) (LRB): COLONOSCOPY (N/A) as a surgical intervention .  The patient's history has been reviewed, patient examined, no change in status, stable for surgery.  I have reviewed the patient's chart and labs.  Questions were answered to the patient's satisfaction.     [name]

## 2011-12-28 ENCOUNTER — Encounter (HOSPITAL_COMMUNITY): Payer: Self-pay | Admitting: Internal Medicine

## 2012-01-03 ENCOUNTER — Encounter: Payer: Self-pay | Admitting: Internal Medicine

## 2012-01-04 ENCOUNTER — Encounter: Payer: Self-pay | Admitting: *Deleted

## 2012-02-08 ENCOUNTER — Other Ambulatory Visit: Payer: Self-pay | Admitting: Family Medicine

## 2012-03-04 ENCOUNTER — Ambulatory Visit: Payer: Medicare Other | Attending: Family Medicine | Admitting: Sleep Medicine

## 2012-03-04 DIAGNOSIS — R0602 Shortness of breath: Secondary | ICD-10-CM

## 2012-03-04 DIAGNOSIS — G4733 Obstructive sleep apnea (adult) (pediatric): Secondary | ICD-10-CM | POA: Insufficient documentation

## 2012-03-04 DIAGNOSIS — G4730 Sleep apnea, unspecified: Secondary | ICD-10-CM

## 2012-03-06 NOTE — Procedures (Signed)
[facility] Craig Wisnewski A. Gerilyn Pilgrim, MD     [email or web address]        NAMETYRIC, RODEHEAVER                ACCOUNT NO.:  [id number]  MEDICAL RECORD NO.:  [id number]          PATIENT TYPE:  OUT  LOCATION:  SLEEP LAB                     FACILITY:  [facility]  PHYSICIAN:  Shashwat Cleary A. Gerilyn Pilgrim, M.D. DATE OF BIRTH:  06/25/1945  DATE OF STUDY:  03/04/2012                           NOCTURNAL POLYSOMNOGRAM  REFERRING PHYSICIAN:  Milinda Antis, MD  INDICATIONS:  A 66 year old who presents with witnessed apnea, hypersomnia, and fatigue along with snoring.  The study being done to evaluate for obstructive sleep apnea syndrome.  MEDICATIONS:  None.  EPWORTH SLEEPINESS SCALE: 1.   BMI 24.  ARCHITECTURAL SUMMARY:  The total recording time is 411 minutes.  Sleep efficiency 75%.  Sleep latency 6 minutes.  REM latency 39 minutes. Stage N1 11%, N2 63%, N3 9%, and REM sleep 18%.  RESPIRATORY SUMMARY:  Baseline oxygen saturation is 95, lowest saturation 86 during REM sleep.  Diagnostic AHI 11 and RDI also 11. More events occurred during REM sleep with the REM index being 18 and also the supine index was high.  It was higher at 18.  LIMB MOVEMENT SUMMARY:  PLM index 18.5.  ELECTROCARDIOGRAM SUMMARY:  Average heart rate is 65 with no significant dysrhythmias observed.  IMPRESSION: 1. Mild to moderate obstructive sleep apnea syndrome. 2. Moderate periodic limb movement disorder sleep. 3. 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  RECOMMENDATION:  Formal CPAP titration recording.  Thanks for this referral.   Zillah Alexie A. Gerilyn Pilgrim, M.D.    KAD/MEDQ  D:  03/06/2012 09:04:56  T:  03/06/2012 09:44:43  Job:  [id number]

## 2012-03-15 ENCOUNTER — Other Ambulatory Visit: Payer: Self-pay | Admitting: Family Medicine

## 2012-03-15 DIAGNOSIS — G4730 Sleep apnea, unspecified: Secondary | ICD-10-CM

## 2012-03-15 DIAGNOSIS — G2581 Restless legs syndrome: Secondary | ICD-10-CM

## 2012-05-01 ENCOUNTER — Ambulatory Visit (INDEPENDENT_AMBULATORY_CARE_PROVIDER_SITE_OTHER): Payer: Medicare Other | Admitting: Family Medicine

## 2012-05-01 ENCOUNTER — Encounter: Payer: Self-pay | Admitting: Family Medicine

## 2012-05-01 VITALS — BP 130/74 | HR 92 | Resp 18 | Ht 72.5 in | Wt 188.0 lb

## 2012-05-01 DIAGNOSIS — J449 Chronic obstructive pulmonary disease, unspecified: Secondary | ICD-10-CM

## 2012-05-01 DIAGNOSIS — F17200 Nicotine dependence, unspecified, uncomplicated: Secondary | ICD-10-CM

## 2012-05-01 DIAGNOSIS — G4733 Obstructive sleep apnea (adult) (pediatric): Secondary | ICD-10-CM

## 2012-05-01 DIAGNOSIS — Z720 Tobacco use: Secondary | ICD-10-CM

## 2012-05-01 DIAGNOSIS — G4700 Insomnia, unspecified: Secondary | ICD-10-CM

## 2012-05-01 DIAGNOSIS — E785 Hyperlipidemia, unspecified: Secondary | ICD-10-CM

## 2012-05-01 MED ORDER — ATORVASTATIN CALCIUM 20 MG PO TABS: 20.0000 mg | ORAL_TABLET | Freq: Every day | ORAL | Status: DC

## 2012-05-01 MED ORDER — TEMAZEPAM 15 MG PO CAPS: 15.0000 mg | ORAL_CAPSULE | Freq: Every evening | ORAL | Status: DC | PRN

## 2012-05-01 NOTE — Patient Instructions (Signed)
Continue current medications  Get your flu shot and shingles vaccine at the pharmacy  Restoril for sleep, 1 hour before bedtime  Continue to work on the smoking F/U 6 months

## 2012-05-02 DIAGNOSIS — G4733 Obstructive sleep apnea (adult) (pediatric): Secondary | ICD-10-CM | POA: Insufficient documentation

## 2012-05-02 NOTE — Progress Notes (Signed)
  Subjective:    Patient ID: Andre [name], male    DOB: 13-May-1946, 66 y.o.   MRN: [id number]  HPI Pt here to f/u chronic medical problems, continues to have difficulty sleeping, wants to try medication, has been taking flexeril from his son who had a back surgery and states this works very well Due for fasting labs Seen by neurology, to have CPAP Set up this week   Review of Systems  GEN- denies fatigue, fever, weight loss,weakness, recent illness HEENT- denies eye drainage, change in vision, nasal discharge, CVS- denies chest pain, palpitations RESP- denies SOB, cough, wheeze ABD- denies N/V, change in stools, abd pain GU- denies dysuria, hematuria, dribbling, incontinence MSK- denies joint pain, muscle aches, injury Neuro- denies headache, dizziness, syncope, seizure activity      Objective:   Physical Exam GEN- NAD, alert and oriented x3 HEENT- PERRL, EOMI, non injected sclera, pink conjunctiva, MMM, oropharynx clear Neck- Supple,  CVS- RRR, no murmur RESP-CTAB EXT- No edema Pulses- Radial, DP- 2+        Assessment & Plan:

## 2012-05-02 NOTE — Assessment & Plan Note (Signed)
Discussed muscle relaxants are not indicated for sleep in his case Trial of restoril Ambien did not help in the past

## 2012-05-02 NOTE — Assessment & Plan Note (Signed)
Down to 4cig/day, continue to work on cessation

## 2012-05-02 NOTE — Assessment & Plan Note (Signed)
CPAP to be started

## 2012-05-02 NOTE — Assessment & Plan Note (Signed)
Check FLP on lipitor 

## 2012-05-04 ENCOUNTER — Ambulatory Visit: Payer: Medicare Other | Admitting: Family Medicine

## 2012-06-22 ENCOUNTER — Telehealth: Payer: Self-pay | Admitting: Family Medicine

## 2012-06-22 ENCOUNTER — Other Ambulatory Visit: Payer: Self-pay

## 2012-06-22 MED ORDER — TRAZODONE HCL 50 MG PO TABS: 50.0000 mg | ORAL_TABLET | Freq: Every day | ORAL | Status: DC

## 2012-06-22 NOTE — Telephone Encounter (Signed)
Spoke with patient and he stated that his copay for his temazepam is going up to 95.  He is unable to afford this and states that his insurance sent him a letter that stated that trazodone hcl is covered with a 8 copay.

## 2012-06-22 NOTE — Telephone Encounter (Signed)
Called patient.  No option to leave message.  Will try again.

## 2012-06-22 NOTE — Telephone Encounter (Signed)
Change to Trazodone 50mg  at bedtime

## 2012-06-27 NOTE — Telephone Encounter (Signed)
Patient aware.

## 2012-10-18 ENCOUNTER — Other Ambulatory Visit: Payer: Self-pay | Admitting: Family Medicine

## 2012-10-30 ENCOUNTER — Ambulatory Visit (INDEPENDENT_AMBULATORY_CARE_PROVIDER_SITE_OTHER): Payer: Medicare Other | Admitting: Family Medicine

## 2012-10-30 ENCOUNTER — Encounter: Payer: Self-pay | Admitting: Family Medicine

## 2012-10-30 VITALS — BP 118/68 | HR 81 | Resp 16 | Ht 72.5 in | Wt 186.0 lb

## 2012-10-30 DIAGNOSIS — F17200 Nicotine dependence, unspecified, uncomplicated: Secondary | ICD-10-CM

## 2012-10-30 DIAGNOSIS — G4733 Obstructive sleep apnea (adult) (pediatric): Secondary | ICD-10-CM

## 2012-10-30 DIAGNOSIS — R413 Other amnesia: Secondary | ICD-10-CM

## 2012-10-30 DIAGNOSIS — Z720 Tobacco use: Secondary | ICD-10-CM

## 2012-10-30 DIAGNOSIS — E785 Hyperlipidemia, unspecified: Secondary | ICD-10-CM

## 2012-10-30 DIAGNOSIS — J449 Chronic obstructive pulmonary disease, unspecified: Secondary | ICD-10-CM

## 2012-10-30 MED ORDER — OMEPRAZOLE 20 MG PO CPDR: 20.0000 mg | DELAYED_RELEASE_CAPSULE | Freq: Every day | ORAL | Status: DC

## 2012-10-30 MED ORDER — TRAZODONE HCL 50 MG PO TABS: ORAL_TABLET | ORAL | Status: DC

## 2012-10-30 MED ORDER — ATORVASTATIN CALCIUM 20 MG PO TABS: 20.0000 mg | ORAL_TABLET | Freq: Every day | ORAL | Status: DC

## 2012-10-30 NOTE — Patient Instructions (Addendum)
Continue current medications Get the labs fasting  Your mini mental status exam was normal  Right now you do not need any memory medications Add a centrum silver multivitamin  Try brain teasers, puzzles, sodoku , reading to keep your mind strong F/U 6 months [employer]

## 2012-10-30 NOTE — Progress Notes (Signed)
  Subjective:    Patient ID: Andre [name], male    DOB: September 25, 1945, 67 y.o.   MRN: [id number]  HPI  Patient here to follow chronic medical problems. He states that his memory has been getting bad lately and his children have noticed. He does remember going to [employer] and losing his car he also lost her cell phone. He cannot recall people's birthdays. He has not been lost himself. Tolerating his medications without any problems the trazodone is helping with sleep.  Review of Systems  GEN- denies fatigue, fever, weight loss,weakness, recent illness HEENT- denies eye drainage, change in vision, nasal discharge, CVS- denies chest pain, palpitations RESP- denies SOB, cough, wheeze ABD- denies N/V, change in stools, abd pain GU- denies dysuria, hematuria, dribbling, incontinence MSK- denies joint pain, muscle aches, injury Neuro- denies headache, dizziness, syncope, seizure activity      Objective:   Physical Exam  GEN- NAD, alert and oriented x3 HEENT- PERRL, EOMI, non injected sclera, pink conjunctiva, MMM, oropharynx clear Neck- Supple,  CVS- RRR, no murmur RESP-CTAB EXT- No edema Pulses- Radial, DP- 2+ Neuro- CNII-XII in tact, no focal deficits  MMSE 30/30     Assessment & Plan:

## 2012-11-01 DIAGNOSIS — R413 Other amnesia: Secondary | ICD-10-CM | POA: Insufficient documentation

## 2012-11-01 NOTE — Assessment & Plan Note (Signed)
He is not using CPAP he was evaluated by neurology and states he was basically talked out of its use and was told that his sleep apnea was not severe

## 2012-11-01 NOTE — Assessment & Plan Note (Signed)
Very mild disease no further intervention needed at this time he needs to quit smoking

## 2012-11-01 NOTE — Assessment & Plan Note (Signed)
Plan to recheck fasting lipid panel he is on Lipitor

## 2012-11-01 NOTE — Assessment & Plan Note (Signed)
Continues to smoke we'll continue to work on smoking cessation

## 2012-11-01 NOTE — Assessment & Plan Note (Signed)
His Mini-Mental Status exam and neurological exam is fairly normal. I will obtain RPR and B12 with his basic labs. I see no reason to do an MRI or to start any medications and patient agrees he will work on some brain teasers

## 2012-11-27 ENCOUNTER — Other Ambulatory Visit: Payer: Self-pay | Admitting: Family Medicine

## 2013-06-17 ENCOUNTER — Other Ambulatory Visit: Payer: Self-pay | Admitting: Family Medicine

## 2013-07-22 NOTE — Telephone Encounter (Signed)
Patient is aware 

## 2013-08-08 ENCOUNTER — Other Ambulatory Visit: Payer: Self-pay | Admitting: Family Medicine

## 2013-08-08 ENCOUNTER — Encounter: Payer: Self-pay | Admitting: *Deleted

## 2013-08-08 NOTE — Telephone Encounter (Signed)
Medication filled x1 with no refills.   Requires office visit before any further refills can be given.   Letter sent.  

## 2013-10-14 ENCOUNTER — Other Ambulatory Visit: Payer: Self-pay | Admitting: Family Medicine

## 2013-10-14 NOTE — Telephone Encounter (Signed)
Medication filled x1 with no refills.   Requires office visit before any further refills can be given.  

## 2014-09-22 DIAGNOSIS — E782 Mixed hyperlipidemia: Secondary | ICD-10-CM | POA: Diagnosis not present

## 2014-10-01 DIAGNOSIS — G4730 Sleep apnea, unspecified: Secondary | ICD-10-CM | POA: Diagnosis not present

## 2014-10-01 DIAGNOSIS — E782 Mixed hyperlipidemia: Secondary | ICD-10-CM | POA: Diagnosis not present

## 2014-10-01 DIAGNOSIS — R7301 Impaired fasting glucose: Secondary | ICD-10-CM | POA: Diagnosis not present

## 2014-10-01 DIAGNOSIS — G4701 Insomnia due to medical condition: Secondary | ICD-10-CM | POA: Diagnosis not present

## 2014-10-20 DIAGNOSIS — K2900 Acute gastritis without bleeding: Secondary | ICD-10-CM | POA: Diagnosis not present

## 2015-04-06 DIAGNOSIS — E782 Mixed hyperlipidemia: Secondary | ICD-10-CM | POA: Diagnosis not present

## 2015-04-06 DIAGNOSIS — Z125 Encounter for screening for malignant neoplasm of prostate: Secondary | ICD-10-CM | POA: Diagnosis not present

## 2015-04-06 DIAGNOSIS — R7301 Impaired fasting glucose: Secondary | ICD-10-CM | POA: Diagnosis not present

## 2015-04-13 DIAGNOSIS — E782 Mixed hyperlipidemia: Secondary | ICD-10-CM | POA: Diagnosis not present

## 2015-04-13 DIAGNOSIS — I10 Essential (primary) hypertension: Secondary | ICD-10-CM | POA: Diagnosis not present

## 2015-04-13 DIAGNOSIS — Z23 Encounter for immunization: Secondary | ICD-10-CM | POA: Diagnosis not present

## 2015-04-13 DIAGNOSIS — R7301 Impaired fasting glucose: Secondary | ICD-10-CM | POA: Diagnosis not present

## 2015-04-13 DIAGNOSIS — R079 Chest pain, unspecified: Secondary | ICD-10-CM | POA: Diagnosis not present

## 2015-04-27 DIAGNOSIS — R51 Headache: Secondary | ICD-10-CM | POA: Diagnosis not present

## 2015-04-27 DIAGNOSIS — F5101 Primary insomnia: Secondary | ICD-10-CM | POA: Diagnosis not present

## 2015-04-27 DIAGNOSIS — I10 Essential (primary) hypertension: Secondary | ICD-10-CM | POA: Diagnosis not present

## 2015-08-10 DIAGNOSIS — I10 Essential (primary) hypertension: Secondary | ICD-10-CM | POA: Diagnosis not present

## 2015-08-10 DIAGNOSIS — R7301 Impaired fasting glucose: Secondary | ICD-10-CM | POA: Diagnosis not present

## 2015-08-17 DIAGNOSIS — E782 Mixed hyperlipidemia: Secondary | ICD-10-CM | POA: Diagnosis not present

## 2015-08-17 DIAGNOSIS — I10 Essential (primary) hypertension: Secondary | ICD-10-CM | POA: Diagnosis not present

## 2015-08-17 DIAGNOSIS — F5101 Primary insomnia: Secondary | ICD-10-CM | POA: Diagnosis not present

## 2015-08-17 DIAGNOSIS — H269 Unspecified cataract: Secondary | ICD-10-CM | POA: Diagnosis not present

## 2015-08-17 DIAGNOSIS — R7301 Impaired fasting glucose: Secondary | ICD-10-CM | POA: Diagnosis not present

## 2015-12-03 DIAGNOSIS — H2512 Age-related nuclear cataract, left eye: Secondary | ICD-10-CM | POA: Diagnosis not present

## 2015-12-03 DIAGNOSIS — H2513 Age-related nuclear cataract, bilateral: Secondary | ICD-10-CM | POA: Diagnosis not present

## 2015-12-10 ENCOUNTER — Inpatient Hospital Stay (HOSPITAL_COMMUNITY): Admission: RE | Admit: 2015-12-10 | Payer: Self-pay | Source: Ambulatory Visit

## 2015-12-29 ENCOUNTER — Other Ambulatory Visit: Payer: Self-pay

## 2015-12-29 ENCOUNTER — Encounter (HOSPITAL_COMMUNITY): Payer: Self-pay

## 2015-12-29 ENCOUNTER — Encounter (HOSPITAL_COMMUNITY)
Admission: RE | Admit: 2015-12-29 | Discharge: 2015-12-29 | Disposition: A | Discharge status: Home / Self Care | Payer: Medicare Other | Source: Ambulatory Visit | Attending: Ophthalmology | Admitting: Ophthalmology

## 2015-12-29 DIAGNOSIS — Z7982 Long term (current) use of aspirin: Secondary | ICD-10-CM | POA: Diagnosis not present

## 2015-12-29 DIAGNOSIS — K219 Gastro-esophageal reflux disease without esophagitis: Secondary | ICD-10-CM | POA: Diagnosis not present

## 2015-12-29 DIAGNOSIS — H2512 Age-related nuclear cataract, left eye: Secondary | ICD-10-CM | POA: Diagnosis not present

## 2015-12-29 DIAGNOSIS — J449 Chronic obstructive pulmonary disease, unspecified: Secondary | ICD-10-CM | POA: Diagnosis not present

## 2015-12-29 DIAGNOSIS — I10 Essential (primary) hypertension: Secondary | ICD-10-CM | POA: Diagnosis not present

## 2015-12-29 DIAGNOSIS — F17200 Nicotine dependence, unspecified, uncomplicated: Secondary | ICD-10-CM | POA: Diagnosis not present

## 2015-12-29 DIAGNOSIS — Z8673 Personal history of transient ischemic attack (TIA), and cerebral infarction without residual deficits: Secondary | ICD-10-CM | POA: Diagnosis not present

## 2015-12-29 DIAGNOSIS — Z79899 Other long term (current) drug therapy: Secondary | ICD-10-CM | POA: Diagnosis not present

## 2015-12-29 DIAGNOSIS — G4730 Sleep apnea, unspecified: Secondary | ICD-10-CM | POA: Diagnosis not present

## 2015-12-29 HISTORY — DX: Essential (primary) hypertension: I10

## 2015-12-29 HISTORY — DX: Sleep apnea, unspecified: G47.30

## 2015-12-29 LAB — CBC WITH DIFFERENTIAL/PLATELET
BASOS ABS: 0.1 10*3/uL (ref 0.0–0.1)
Basophils Relative: 1 %
EOS PCT: 2 %
Eosinophils Absolute: 0.1 10*3/uL (ref 0.0–0.7)
HCT: 46 % (ref 39.0–52.0)
Hemoglobin: 15.9 g/dL (ref 13.0–17.0)
LYMPHS PCT: 41 %
Lymphs Abs: 3.4 10*3/uL (ref 0.7–4.0)
MCH: 33.5 pg (ref 26.0–34.0)
MCHC: 34.6 g/dL (ref 30.0–36.0)
MCV: 97 fL (ref 78.0–100.0)
Monocytes Absolute: 0.8 10*3/uL (ref 0.1–1.0)
Monocytes Relative: 9 %
Neutro Abs: 4 10*3/uL (ref 1.7–7.7)
Neutrophils Relative %: 47 %
PLATELETS: 340 10*3/uL (ref 150–400)
RBC: 4.74 MIL/uL (ref 4.22–5.81)
RDW: 14.2 % (ref 11.5–15.5)
WBC: 8.4 10*3/uL (ref 4.0–10.5)

## 2015-12-29 LAB — BASIC METABOLIC PANEL
ANION GAP: 7 (ref 5–15)
BUN: 11 mg/dL (ref 6–20)
CO2: 26 mmol/L (ref 22–32)
Calcium: 8.9 mg/dL (ref 8.9–10.3)
Chloride: 103 mmol/L (ref 101–111)
Creatinine, Ser: 0.96 mg/dL (ref 0.61–1.24)
Glucose, Bld: 83 mg/dL (ref 65–99)
POTASSIUM: 4.4 mmol/L (ref 3.5–5.1)
SODIUM: 136 mmol/L (ref 135–145)

## 2015-12-29 NOTE — Patient Instructions (Addendum)
Your procedure is scheduled on: 12/31/2015  Report to [facility] at  650   AM.  Call this number if you have problems the morning of surgery: [phone]   Do not eat food or drink liquids :After Midnight.      Take these medicines the morning of surgery with A SIP OF WATER: cozaar, prilosec.   Do not wear jewelry, make-up or nail polish.  Do not wear lotions, powders, or perfumes. You may wear deodorant.  Do not shave 48 hours prior to surgery.  Do not bring valuables to the hospital.  Contacts, dentures or bridgework may not be worn into surgery.  Leave suitcase in the car. After surgery it may be brought to your room.  For patients admitted to the hospital, checkout time is 11:00 AM the day of discharge.   Patients discharged the day of surgery will not be allowed to drive home.  :     Please read over the following fact sheets that you were given: Coughing and Deep Breathing, Surgical Site Infection Prevention, Anesthesia Post-op Instructions and Care and Recovery After Surgery    Cataract A cataract is a clouding of the lens of the eye. When a lens becomes cloudy, vision is reduced based on the degree and nature of the clouding. Many cataracts reduce vision to some degree. Some cataracts make people more near-sighted as they develop. Other cataracts increase glare. Cataracts that are ignored and become worse can sometimes look white. The white color can be seen through the pupil. CAUSES   Aging. However, cataracts may occur at any age, even in newborns.   Certain drugs.   Trauma to the eye.   Certain diseases such as diabetes.   Specific eye diseases such as chronic inflammation inside the eye or a sudden attack of a rare form of glaucoma.   Inherited or acquired medical problems.  SYMPTOMS   Gradual, progressive drop in vision in the affected eye.   Severe, rapid visual loss. This most often happens when trauma is the cause.  DIAGNOSIS  To detect a cataract, an eye  doctor examines the lens. Cataracts are best diagnosed with an exam of the eyes with the pupils enlarged (dilated) by drops.  TREATMENT  For an early cataract, vision may improve by using different eyeglasses or stronger lighting. If that does not help your vision, surgery is the only effective treatment. A cataract needs to be surgically removed when vision loss interferes with your everyday activities, such as driving, reading, or watching TV. A cataract may also have to be removed if it prevents examination or treatment of another eye problem. Surgery removes the cloudy lens and usually replaces it with a substitute lens (intraocular lens, IOL).  At a time when both you and your doctor agree, the cataract will be surgically removed. If you have cataracts in both eyes, only one is usually removed at a time. This allows the operated eye to heal and be out of danger from any possible problems after surgery (such as infection or poor wound healing). In rare cases, a cataract may be doing damage to your eye. In these cases, your caregiver may advise surgical removal right away. The vast majority of people who have cataract surgery have better vision afterward. HOME CARE INSTRUCTIONS  If you are not planning surgery, you may be asked to do the following:  Use different eyeglasses.   Use stronger or brighter lighting.   Ask your eye doctor about reducing your medicine  dose or changing medicines if it is thought that a medicine caused your cataract. Changing medicines does not make the cataract go away on its own.   Become familiar with your surroundings. Poor vision can lead to injury. Avoid bumping into things on the affected side. You are at a higher risk for tripping or falling.   Exercise extreme care when driving or operating machinery.   Wear sunglasses if you are sensitive to bright light or experiencing problems with glare.  SEEK IMMEDIATE MEDICAL CARE IF:   You have a worsening or sudden  vision loss.   You notice redness, swelling, or increasing pain in the eye.   You have a fever.  Document Released: 05/23/2005 Document Revised: 05/12/2011 Document Reviewed: 01/14/2011 [facility] Patient Information 2012 [place].PATIENT INSTRUCTIONS POST-ANESTHESIA  IMMEDIATELY FOLLOWING SURGERY:  Do not drive or operate machinery for the first twenty four hours after surgery.  Do not make any important decisions for twenty four hours after surgery or while taking narcotic pain medications or sedatives.  If you develop intractable nausea and vomiting or a severe headache please notify your doctor immediately.  FOLLOW-UP:  Please make an appointment with your surgeon as instructed. You do not need to follow up with anesthesia unless specifically instructed to do so.  WOUND CARE INSTRUCTIONS (if applicable):  Keep a dry clean dressing on the anesthesia/puncture wound site if there is drainage.  Once the wound has quit draining you may leave it open to air.  Generally you should leave the bandage intact for twenty four hours unless there is drainage.  If the epidural site drains for more than 36-48 hours please call the anesthesia department.  QUESTIONS?:  Please feel free to call your physician or the hospital operator if you have any questions, and they will be happy to assist you.

## 2015-12-31 ENCOUNTER — Ambulatory Visit (HOSPITAL_COMMUNITY): Payer: Medicare Other | Admitting: Anesthesiology

## 2015-12-31 ENCOUNTER — Encounter (HOSPITAL_COMMUNITY): Payer: Self-pay | Admitting: Ophthalmology

## 2015-12-31 ENCOUNTER — Encounter (HOSPITAL_COMMUNITY)
Admission: RE | Disposition: A | Discharge status: Home / Self Care | Payer: Self-pay | Source: Ambulatory Visit | Attending: Ophthalmology

## 2015-12-31 ENCOUNTER — Ambulatory Visit (HOSPITAL_COMMUNITY)
Admission: RE | Admit: 2015-12-31 | Discharge: 2015-12-31 | Disposition: A | Discharge status: Home / Self Care | Payer: Medicare Other | Source: Ambulatory Visit | Attending: Ophthalmology | Admitting: Ophthalmology

## 2015-12-31 DIAGNOSIS — J449 Chronic obstructive pulmonary disease, unspecified: Secondary | ICD-10-CM | POA: Insufficient documentation

## 2015-12-31 DIAGNOSIS — K219 Gastro-esophageal reflux disease without esophagitis: Secondary | ICD-10-CM | POA: Diagnosis not present

## 2015-12-31 DIAGNOSIS — I10 Essential (primary) hypertension: Secondary | ICD-10-CM | POA: Diagnosis not present

## 2015-12-31 DIAGNOSIS — Z7982 Long term (current) use of aspirin: Secondary | ICD-10-CM | POA: Diagnosis not present

## 2015-12-31 DIAGNOSIS — H269 Unspecified cataract: Secondary | ICD-10-CM | POA: Diagnosis not present

## 2015-12-31 DIAGNOSIS — G4730 Sleep apnea, unspecified: Secondary | ICD-10-CM | POA: Diagnosis not present

## 2015-12-31 DIAGNOSIS — H2512 Age-related nuclear cataract, left eye: Secondary | ICD-10-CM | POA: Insufficient documentation

## 2015-12-31 DIAGNOSIS — Z79899 Other long term (current) drug therapy: Secondary | ICD-10-CM | POA: Diagnosis not present

## 2015-12-31 DIAGNOSIS — F17200 Nicotine dependence, unspecified, uncomplicated: Secondary | ICD-10-CM | POA: Insufficient documentation

## 2015-12-31 DIAGNOSIS — Z8673 Personal history of transient ischemic attack (TIA), and cerebral infarction without residual deficits: Secondary | ICD-10-CM | POA: Diagnosis not present

## 2015-12-31 HISTORY — PX: CATARACT EXTRACTION W/PHACO: SHX586

## 2015-12-31 SURGERY — PHACOEMULSIFICATION, CATARACT, WITH IOL INSERTION
Anesthesia: Monitor Anesthesia Care | Site: Eye | Laterality: Left

## 2015-12-31 MED ORDER — MIDAZOLAM HCL 2 MG/2ML IJ SOLN
1.0000 mg | INTRAMUSCULAR | Status: DC | PRN
Start: 2015-12-31 — End: 2015-12-31
  Administered 2015-12-31: 2 mg via INTRAVENOUS

## 2015-12-31 MED ORDER — LACTATED RINGERS IV SOLN
INTRAVENOUS | Status: DC
  Administered 2015-12-31: 08:00:00 via INTRAVENOUS

## 2015-12-31 MED ORDER — POVIDONE-IODINE 5 % OP SOLN
OPHTHALMIC | Status: DC | PRN
  Administered 2015-12-31: 1 via OPHTHALMIC

## 2015-12-31 MED ORDER — PROVISC 10 MG/ML IO SOLN
INTRAOCULAR | Status: DC | PRN
  Administered 2015-12-31: 0.85 mL via INTRAOCULAR

## 2015-12-31 MED ORDER — LIDOCAINE HCL 3.5 % OP GEL
1.0000 "application " | Freq: Once | OPHTHALMIC | Status: AC
  Administered 2015-12-31: 1 via OPHTHALMIC

## 2015-12-31 MED ORDER — BSS IO SOLN
INTRAOCULAR | Status: DC | PRN
  Administered 2015-12-31: 15 mL via INTRAOCULAR

## 2015-12-31 MED ORDER — LIDOCAINE HCL (PF) 1 % IJ SOLN
INTRAMUSCULAR | Status: DC | PRN
  Administered 2015-12-31: .5 mL

## 2015-12-31 MED ORDER — FENTANYL CITRATE (PF) 100 MCG/2ML IJ SOLN
25.0000 ug | INTRAMUSCULAR | Status: AC | PRN
  Administered 2015-12-31 (×2): 25 ug via INTRAVENOUS

## 2015-12-31 MED ORDER — LIDOCAINE 3.5 % OP GEL OPTIME - NO CHARGE
OPHTHALMIC | Status: DC | PRN
  Administered 2015-12-31: 1 [drp] via OPHTHALMIC

## 2015-12-31 MED ORDER — FENTANYL CITRATE (PF) 100 MCG/2ML IJ SOLN
INTRAMUSCULAR | Status: AC
  Filled 2015-12-31: qty 2

## 2015-12-31 MED ORDER — NEOMYCIN-POLYMYXIN-DEXAMETH 3.5-10000-0.1 OP SUSP
OPHTHALMIC | Status: DC | PRN
  Administered 2015-12-31: 2 [drp] via OPHTHALMIC

## 2015-12-31 MED ORDER — EPINEPHRINE HCL 1 MG/ML IJ SOLN
INTRAMUSCULAR | Status: DC | PRN
  Administered 2015-12-31: 500 mL

## 2015-12-31 MED ORDER — MIDAZOLAM HCL 2 MG/2ML IJ SOLN
INTRAMUSCULAR | Status: AC
  Filled 2015-12-31: qty 2

## 2015-12-31 MED ORDER — EPINEPHRINE HCL 1 MG/ML IJ SOLN
INTRAMUSCULAR | Status: AC
  Filled 2015-12-31: qty 1

## 2015-12-31 MED ORDER — PHENYLEPHRINE HCL 2.5 % OP SOLN
1.0000 [drp] | OPHTHALMIC | Status: AC
  Administered 2015-12-31 (×3): 1 [drp] via OPHTHALMIC

## 2015-12-31 MED ORDER — CYCLOPENTOLATE-PHENYLEPHRINE 0.2-1 % OP SOLN
1.0000 [drp] | OPHTHALMIC | Status: AC
  Administered 2015-12-31 (×3): 1 [drp] via OPHTHALMIC

## 2015-12-31 MED ORDER — TETRACAINE HCL 0.5 % OP SOLN
1.0000 [drp] | OPHTHALMIC | Status: AC
Start: 2015-12-31 — End: 2015-12-31
  Administered 2015-12-31 (×3): 1 [drp] via OPHTHALMIC

## 2015-12-31 SURGICAL SUPPLY — 23 items
CAPSULAR TENSION RING-AMO (OPHTHALMIC RELATED) IMPLANT
CLOTH BEACON ORANGE TIMEOUT ST (SAFETY) ×2 IMPLANT
EYE SHIELD UNIVERSAL CLEAR (GAUZE/BANDAGES/DRESSINGS) ×2 IMPLANT
GLOVE BIOGEL PI IND STRL 7.0 (GLOVE) ×1 IMPLANT
GLOVE BIOGEL PI IND STRL 7.5 (GLOVE) IMPLANT
GLOVE BIOGEL PI INDICATOR 7.0 (GLOVE) ×1
GLOVE BIOGEL PI INDICATOR 7.5 (GLOVE)
GLOVE EXAM NITRILE LRG STRL (GLOVE) IMPLANT
GLOVE EXAM NITRILE MD LF STRL (GLOVE) ×2 IMPLANT
KIT VITRECTOMY (OPHTHALMIC RELATED) IMPLANT
PAD ARMBOARD 7.5X6 YLW CONV (MISCELLANEOUS) ×2 IMPLANT
PROC W NO LENS (INTRAOCULAR LENS)
PROC W SPEC LENS (INTRAOCULAR LENS)
PROCESS W NO LENS (INTRAOCULAR LENS) IMPLANT
PROCESS W SPEC LENS (INTRAOCULAR LENS) IMPLANT
RETRACTOR IRIS SIGHTPATH (OPHTHALMIC RELATED) IMPLANT
RING MALYGIN (MISCELLANEOUS) IMPLANT
SIGHTPATH CAT PROC W REG LENS (Ophthalmic Related) ×2 IMPLANT
SYRINGE LUER LOK 1CC (MISCELLANEOUS) ×2 IMPLANT
TAPE SURG TRANSPORE 1 IN (GAUZE/BANDAGES/DRESSINGS) ×1 IMPLANT
TAPE SURGICAL TRANSPORE 1 IN (GAUZE/BANDAGES/DRESSINGS) ×1
VISCOELASTIC ADDITIONAL (OPHTHALMIC RELATED) IMPLANT
WATER STERILE IRR 250ML POUR (IV SOLUTION) ×2 IMPLANT

## 2015-12-31 NOTE — Anesthesia Preprocedure Evaluation (Signed)
Anesthesia Evaluation  Patient identified by MRN, date of birth, ID band Patient awake    Reviewed: Allergy & Precautions, NPO status , Patient's Chart, lab work & pertinent test results  Airway Mallampati: IV  TM Distance: <3 FB Neck ROM: Full    Dental  (+) Edentulous Upper, Partial Lower   Pulmonary shortness of breath, sleep apnea , COPD, Current Smoker,    breath sounds clear to auscultation       Cardiovascular hypertension,  Rhythm:Regular Rate:Normal     Neuro/Psych TIA   GI/Hepatic hiatal hernia, GERD  ,  Endo/Other    Renal/GU      Musculoskeletal   Abdominal   Peds  Hematology   Anesthesia Other Findings   Reproductive/Obstetrics                             Anesthesia Physical Anesthesia Plan  ASA: III  Anesthesia Plan: MAC   Post-op Pain Management:    Induction: Intravenous  Airway Management Planned: Nasal Cannula  Additional Equipment:   Intra-op Plan:   Post-operative Plan:   Informed Consent: I have reviewed the patients History and Physical, chart, labs and discussed the procedure including the risks, benefits and alternatives for the proposed anesthesia with the patient or authorized representative who has indicated his/her understanding and acceptance.     Plan Discussed with:   Anesthesia Plan Comments:         Anesthesia Quick Evaluation

## 2015-12-31 NOTE — Discharge Instructions (Signed)
General Anesthesia, Adult °General anesthesia is a sleep-like state of non-feeling produced by medicines (anesthetics). General anesthesia prevents you from being alert and feeling pain during a medical procedure. Your caregiver may recommend general anesthesia if your procedure: °· Is long. °· Is painful or uncomfortable. °· Would be frightening to see or hear. °· Requires you to be still. °· Affects your breathing. °· Causes significant blood loss. °LET YOUR CAREGIVER KNOW ABOUT: °· Allergies to food or medicine. °· Medicines taken, including vitamins, herbs, eyedrops, over-the-counter medicines, and creams. °· Use of steroids (by mouth or creams). °· Previous problems with anesthetics or numbing medicines, including problems experienced by relatives. °· History of bleeding problems or blood clots. °· Previous surgeries and types of anesthetics received. °· Possibility of pregnancy, if this applies. °· Use of cigarettes, alcohol, or illegal drugs. °· Any health condition(s), especially diabetes, sleep apnea, and high blood pressure. °RISKS AND COMPLICATIONS °General anesthesia rarely causes complications. However, if complications do occur, they can be life threatening. Complications include: °· A lung infection. °· A stroke. °· A heart attack. °· Waking up during the procedure. When this occurs, the patient may be unable to move and communicate that he or she is awake. The patient may feel severe pain. °Older adults and adults with serious medical problems are more likely to have complications than adults who are young and healthy. Some complications can be prevented by answering all of your caregiver's questions thoroughly and by following all pre-procedure instructions. It is important to tell your caregiver if any of the pre-procedure instructions, especially those related to diet, were not followed. Any food or liquid in the stomach can cause problems when you are under general anesthesia. °BEFORE THE  PROCEDURE °· Ask your caregiver if you will have to spend the night at the hospital. If you will not have to spend the night, arrange to have an adult drive you and stay with you for 24 hours. °· Follow your caregiver's instructions if you are taking dietary supplements or medicines. Your caregiver may tell you to stop taking them or to reduce your dosage. °· Do not smoke for as long as possible before your procedure. If possible, stop smoking 3-6 weeks before the procedure. °· Do not take new dietary supplements or medicines within 1 week of your procedure unless your caregiver approves them. °· Do not eat within 8 hours of your procedure or as directed by your caregiver. Drink only clear liquids, such as water, black coffee (without milk or cream), and fruit juices (without pulp). °· Do not drink within 3 hours of your procedure or as directed by your caregiver. °· You may brush your teeth on the morning of the procedure, but make sure to spit out the toothpaste and water when finished. °PROCEDURE  °You will receive anesthetics through a mask, through an intravenous (IV) access tube, or through both. A doctor who specializes in anesthesia (anesthesiologist) or a nurse who specializes in anesthesia (nurse anesthetist) or both will stay with you throughout the procedure to make sure you remain unconscious. He or she will also watch your blood pressure, pulse, and oxygen levels to make sure that the anesthetics do not cause any problems. Once you are asleep, a breathing tube or mask may be used to help you breathe. °AFTER THE PROCEDURE °You will wake up after the procedure is complete. You may be in the room where the procedure was performed or in a recovery area. You may have a sore throat   if a breathing tube was used. You may also feel: °· Dizzy. °· Weak. °· Drowsy. °· Confused. °· Nauseous. °· Cold. °These are all normal responses and can be expected to last for up to 24 hours after the procedure is complete. A  caregiver will tell you when you are ready to go home. This will usually be when you are fully awake and in stable condition. °  °This information is not intended to replace advice given to you by your health care provider. Make sure you discuss any questions you have with your health care provider. °  °Document Released: 08/30/2007 Document Revised: 06/13/2014 Document Reviewed: 09/21/2011 °Elsevier Interactive Patient Education ©2016 [employer] ° °

## 2015-12-31 NOTE — Op Note (Signed)
Date of Admission: 12/31/2015  Date of Surgery: 12/31/2015   Pre-Op Dx: Cataract Left Eye  Post-Op Dx: Senile Nuclear Cataract Left  Eye,  Dx Code H25.12  Surgeon: [name], M.D.  Assistants: None  Anesthesia: Topical with MAC  Indications: Painless, progressive loss of vision with compromise of daily activities.  Surgery: Cataract Extraction with Intraocular lens Implant Left Eye  Discription: The patient had dilating drops and viscous lidocaine placed into the Left eye in the pre-op holding area. After transfer to the operating room, a time out was performed. The patient was then prepped and draped. Beginning with a 9 degree blade a paracentesis port was made at the surgeon's 2 o'clock position. The anterior chamber was then filled with 1% non-preserved lidocaine. This was followed by filling the anterior chamber with Provisc.  A 2.58mm keratome blade was used to make a clear corneal incision at the temporal limbus.  A bent cystatome needle was used to create a continuous tear capsulotomy. Hydrodissection was performed with balanced salt solution on a Fine canula. The lens nucleus was then removed using the phacoemulsification handpiece. Residual cortex was removed with the I&A handpiece. The anterior chamber and capsular bag were refilled with Provisc. A posterior chamber intraocular lens was placed into the capsular bag with it's injector. The implant was positioned with the Kuglan hook. The Provisc was then removed from the anterior chamber and capsular bag with the I&A handpiece. Stromal hydration of the main incision and paracentesis port was performed with BSS on a Fine canula. The wounds were tested for leak which was negative. The patient tolerated the procedure well. There were no operative complications. The patient was then transferred to the recovery room in stable condition.  Complications: None  Specimen: None  EBL: None  Prosthetic device: Hoya iSert 250, power 21.0 D, SN  [id number].

## 2015-12-31 NOTE — Transfer of Care (Signed)
Immediate Anesthesia Transfer of Care Note  Patient: Andre [name]  Procedure(s) Performed: Procedure(s): CATARACT EXTRACTION PHACO AND INTRAOCULAR LENS PLACEMENT LEFT EYE; CDE: 17.28 (Left)  Patient Location: Short Stay  Anesthesia Type:MAC  Level of Consciousness: awake, alert , oriented and patient cooperative  Airway & Oxygen Therapy: Patient Spontanous Breathing  Post-op Assessment: Report given to RN, Post -op Vital signs reviewed and stable and Patient moving all extremities  Post vital signs: Reviewed and stable  Last Vitals:  Vitals:   12/31/15 0735 12/31/15 0740  BP:  124/76  Resp: 15 19    Last Pain: There were no vitals filed for this visit.    Patients Stated Pain Goal: 10 ([id number])  Complications: No apparent anesthesia complications

## 2015-12-31 NOTE — H&P (Signed)
I have reviewed the H&P, the patient was re-examined, and I have identified no interval changes in medical condition and plan of care since the history and physical of record  

## 2015-12-31 NOTE — Addendum Note (Signed)
Addendum  created 12/31/15 0920 by Charmaine Downs, CRNA   Charge Capture section accepted

## 2015-12-31 NOTE — Anesthesia Postprocedure Evaluation (Signed)
Anesthesia Post Note  Patient: Andre [name]  Procedure(s) Performed: Procedure(s) (LRB): CATARACT EXTRACTION PHACO AND INTRAOCULAR LENS PLACEMENT LEFT EYE; CDE: 17.28 (Left)  Patient location during evaluation: Short Stay Anesthesia Type: MAC Level of consciousness: awake and alert, oriented and patient cooperative Pain management: pain level controlled Vital Signs Assessment: post-procedure vital signs reviewed and stable Respiratory status: spontaneous breathing, nonlabored ventilation and respiratory function stable Cardiovascular status: blood pressure returned to baseline and stable Postop Assessment: no signs of nausea or vomiting Anesthetic complications: no    Last Vitals:  Vitals:   12/31/15 0740 12/31/15 0842  BP: 124/76 (!) 149/80  Pulse:  (!) 57  Resp: 19 18  Temp:  36.5 C    Last Pain:  Vitals:   12/31/15 0842  TempSrc: Oral                 [name] J

## 2016-01-06 ENCOUNTER — Encounter (HOSPITAL_COMMUNITY): Payer: Self-pay | Admitting: Ophthalmology

## 2016-01-11 DIAGNOSIS — H2511 Age-related nuclear cataract, right eye: Secondary | ICD-10-CM | POA: Diagnosis not present

## 2016-01-11 DIAGNOSIS — Z961 Presence of intraocular lens: Secondary | ICD-10-CM | POA: Diagnosis not present

## 2016-02-15 DIAGNOSIS — E782 Mixed hyperlipidemia: Secondary | ICD-10-CM | POA: Diagnosis not present

## 2016-02-15 DIAGNOSIS — R7301 Impaired fasting glucose: Secondary | ICD-10-CM | POA: Diagnosis not present

## 2016-02-22 DIAGNOSIS — I10 Essential (primary) hypertension: Secondary | ICD-10-CM | POA: Diagnosis not present

## 2016-02-22 DIAGNOSIS — E782 Mixed hyperlipidemia: Secondary | ICD-10-CM | POA: Diagnosis not present

## 2016-02-22 DIAGNOSIS — Z0001 Encounter for general adult medical examination with abnormal findings: Secondary | ICD-10-CM | POA: Diagnosis not present

## 2016-02-22 DIAGNOSIS — Z23 Encounter for immunization: Secondary | ICD-10-CM | POA: Diagnosis not present

## 2016-02-22 DIAGNOSIS — F5101 Primary insomnia: Secondary | ICD-10-CM | POA: Diagnosis not present

## 2016-02-22 DIAGNOSIS — R7301 Impaired fasting glucose: Secondary | ICD-10-CM | POA: Diagnosis not present

## 2016-02-29 ENCOUNTER — Encounter (HOSPITAL_COMMUNITY)
Admission: RE | Admit: 2016-02-29 | Discharge: 2016-02-29 | Disposition: A | Discharge status: Home / Self Care | Payer: Medicare Other | Source: Ambulatory Visit | Attending: Ophthalmology | Admitting: Ophthalmology

## 2016-03-03 ENCOUNTER — Ambulatory Visit (HOSPITAL_COMMUNITY): Admission: RE | Admit: 2016-03-03 | Payer: Medicare Other | Source: Ambulatory Visit | Admitting: Ophthalmology

## 2016-03-03 ENCOUNTER — Encounter (HOSPITAL_COMMUNITY): Admission: RE | Payer: Self-pay | Source: Ambulatory Visit

## 2016-03-03 SURGERY — PHACOEMULSIFICATION, CATARACT, WITH IOL INSERTION
Anesthesia: Monitor Anesthesia Care | Site: Eye | Laterality: Right

## 2016-03-07 ENCOUNTER — Other Ambulatory Visit (HOSPITAL_COMMUNITY): Payer: Self-pay | Admitting: Internal Medicine

## 2016-03-07 DIAGNOSIS — Z87891 Personal history of nicotine dependence: Secondary | ICD-10-CM

## 2016-03-21 ENCOUNTER — Ambulatory Visit (HOSPITAL_COMMUNITY)
Admission: RE | Admit: 2016-03-21 | Discharge: 2016-03-21 | Disposition: A | Discharge status: Home / Self Care | Payer: Medicare Other | Source: Ambulatory Visit | Attending: Internal Medicine | Admitting: Internal Medicine

## 2016-03-21 DIAGNOSIS — I2510 Atherosclerotic heart disease of native coronary artery without angina pectoris: Secondary | ICD-10-CM | POA: Diagnosis not present

## 2016-03-21 DIAGNOSIS — Z87891 Personal history of nicotine dependence: Secondary | ICD-10-CM | POA: Insufficient documentation

## 2016-03-21 DIAGNOSIS — I700 Atherosclerosis of aorta: Secondary | ICD-10-CM | POA: Diagnosis not present

## 2016-05-09 DIAGNOSIS — F5101 Primary insomnia: Secondary | ICD-10-CM | POA: Diagnosis not present

## 2016-08-17 DIAGNOSIS — R7301 Impaired fasting glucose: Secondary | ICD-10-CM | POA: Diagnosis not present

## 2016-08-17 DIAGNOSIS — Z1159 Encounter for screening for other viral diseases: Secondary | ICD-10-CM | POA: Diagnosis not present

## 2016-08-17 DIAGNOSIS — I10 Essential (primary) hypertension: Secondary | ICD-10-CM | POA: Diagnosis not present

## 2016-08-19 DIAGNOSIS — J060 Acute laryngopharyngitis: Secondary | ICD-10-CM | POA: Diagnosis not present

## 2016-08-19 DIAGNOSIS — R05 Cough: Secondary | ICD-10-CM | POA: Diagnosis not present

## 2016-08-22 DIAGNOSIS — F5101 Primary insomnia: Secondary | ICD-10-CM | POA: Diagnosis not present

## 2016-08-22 DIAGNOSIS — I10 Essential (primary) hypertension: Secondary | ICD-10-CM | POA: Diagnosis not present

## 2016-08-22 DIAGNOSIS — R7301 Impaired fasting glucose: Secondary | ICD-10-CM | POA: Diagnosis not present

## 2016-08-22 DIAGNOSIS — E782 Mixed hyperlipidemia: Secondary | ICD-10-CM | POA: Diagnosis not present

## 2016-09-05 ENCOUNTER — Other Ambulatory Visit: Payer: Self-pay | Admitting: Family Medicine

## 2016-11-10 ENCOUNTER — Encounter: Payer: Self-pay | Admitting: Internal Medicine

## 2017-02-15 DIAGNOSIS — R7301 Impaired fasting glucose: Secondary | ICD-10-CM | POA: Diagnosis not present

## 2017-02-15 DIAGNOSIS — I10 Essential (primary) hypertension: Secondary | ICD-10-CM | POA: Diagnosis not present

## 2017-02-20 DIAGNOSIS — F5101 Primary insomnia: Secondary | ICD-10-CM | POA: Diagnosis not present

## 2017-02-20 DIAGNOSIS — I10 Essential (primary) hypertension: Secondary | ICD-10-CM | POA: Diagnosis not present

## 2017-02-20 DIAGNOSIS — E782 Mixed hyperlipidemia: Secondary | ICD-10-CM | POA: Diagnosis not present

## 2017-02-20 DIAGNOSIS — Z23 Encounter for immunization: Secondary | ICD-10-CM | POA: Diagnosis not present

## 2017-02-20 DIAGNOSIS — R7301 Impaired fasting glucose: Secondary | ICD-10-CM | POA: Diagnosis not present

## 2017-04-18 DIAGNOSIS — J019 Acute sinusitis, unspecified: Secondary | ICD-10-CM | POA: Diagnosis not present

## 2017-06-14 ENCOUNTER — Encounter (HOSPITAL_COMMUNITY): Payer: Self-pay | Admitting: *Deleted

## 2017-06-14 ENCOUNTER — Emergency Department (HOSPITAL_COMMUNITY)
Admission: EM | Admit: 2017-06-14 | Discharge: 2017-06-14 | Disposition: A | Discharge status: Home / Self Care | Payer: Medicare Other | Attending: Emergency Medicine | Admitting: Emergency Medicine

## 2017-06-14 ENCOUNTER — Other Ambulatory Visit: Payer: Self-pay

## 2017-06-14 DIAGNOSIS — Z79899 Other long term (current) drug therapy: Secondary | ICD-10-CM | POA: Insufficient documentation

## 2017-06-14 DIAGNOSIS — J449 Chronic obstructive pulmonary disease, unspecified: Secondary | ICD-10-CM | POA: Diagnosis not present

## 2017-06-14 DIAGNOSIS — H5711 Ocular pain, right eye: Secondary | ICD-10-CM | POA: Insufficient documentation

## 2017-06-14 DIAGNOSIS — I10 Essential (primary) hypertension: Secondary | ICD-10-CM | POA: Insufficient documentation

## 2017-06-14 DIAGNOSIS — F17210 Nicotine dependence, cigarettes, uncomplicated: Secondary | ICD-10-CM | POA: Diagnosis not present

## 2017-06-14 DIAGNOSIS — Z7982 Long term (current) use of aspirin: Secondary | ICD-10-CM | POA: Diagnosis not present

## 2017-06-14 MED ORDER — TETRACAINE HCL 0.5 % OP SOLN
OPHTHALMIC | Status: AC
  Administered 2017-06-14: 01:00:00
  Filled 2017-06-14: qty 4

## 2017-06-14 MED ORDER — HYDROCODONE-ACETAMINOPHEN 5-325 MG PO TABS: 1.0000 | ORAL_TABLET | Freq: Four times a day (QID) | ORAL | 0 refills | Status: DC | PRN

## 2017-06-14 MED ORDER — ERYTHROMYCIN 5 MG/GM OP OINT: TOPICAL_OINTMENT | OPHTHALMIC | 0 refills | Status: DC

## 2017-06-14 MED ORDER — FLUORESCEIN SODIUM 1 MG OP STRP
ORAL_STRIP | OPHTHALMIC | Status: AC
  Administered 2017-06-14: 01:00:00
  Filled 2017-06-14: qty 1

## 2017-06-14 MED ORDER — HYDROCODONE-ACETAMINOPHEN 5-325 MG PO TABS
1.0000 | ORAL_TABLET | Freq: Once | ORAL | Status: AC
  Administered 2017-06-14: 1 via ORAL
  Filled 2017-06-14: qty 1

## 2017-06-14 NOTE — Discharge Instructions (Signed)
You were seen today for right eye pain.  Not appreciate any foreign body or significant corneal abrasion.  However, you will be treated as if you have a corneal abrasion.  These can be very painful.  Use ointment and take pain medications.  It will likely heal in the next 2-3 days.  Contact your eye doctor for complete eye exam if still in significant pain.

## 2017-06-14 NOTE — ED Provider Notes (Signed)
[facility] EMERGENCY DEPARTMENT Provider Note   CSN: [id number] Arrival date & time: 06/14/17  0025     History   Chief Complaint Chief Complaint  Patient presents with  . Eye Pain    HPI Andre [name] is a 72 y.o. male.  HPI  This is a 72 year old male who presents with right eye pain.  Patient reports that he went to sleep at 830.  He woke up with pain in the right eye.  He reports a foreign body sensation.  No known injury or foreign body.  Currently rates his pain 8 out of 10.  It is better with pressure applied and ice.  Denies any vision changes.  He does not wear contacts.  Patient reports prior laser surgery in the left eye.  Due for cataract surgery in the right eye.  No history of glaucoma.  She denies headache.  Past Medical History:  Diagnosis Date  . Adenomatous polyp 2002   tcs by [name]  . Diverticula of colon 2002   L side  . GERD (gastroesophageal reflux disease)   . Hemorrhoids 2002   internal and external  . Hiatal hernia 2002   moderate  . Hyperlipidemia   . Hypertension   . Mini stroke ([place]) 1998   no deficits  . Sleep apnea    does not use CPAP. PCP aware.    Patient Active Problem List   Diagnosis Date Noted  . Memory change 11/01/2012  . OSA (obstructive sleep apnea) 05/02/2012  . Hx of adenomatous colonic polyps 12/02/2011  . Routine general medical examination at a health care facility 11/04/2011  . Decreased visual acuity 11/04/2011  . Hyperlipidemia 11/04/2011  . Tobacco use 09/21/2011  . History of TIAs 09/20/2011  . COPD (chronic obstructive pulmonary disease) ([place]) 09/20/2011  . Insomnia 09/20/2011  . Colon polyp 09/20/2011    Past Surgical History:  Procedure Laterality Date  . CATARACT EXTRACTION W/PHACO Left 12/31/2015   Procedure: CATARACT EXTRACTION PHACO AND INTRAOCULAR LENS PLACEMENT LEFT EYE; CDE: 17.28;  Surgeon: [name], MD;  Location: [facility] ORS;  Service: Ophthalmology;  Laterality: Left;  . CHOLECYSTECTOMY   1990   [name]  . COLONOSCOPY  01/23/01   [name]- small internal and external hemorrhoids, L sided mild diverticula. adenomatous polyp removed from rectum.  . COLONOSCOPY  12/26/2011   Procedure: COLONOSCOPY;  Surgeon: [name], MD;  Location: [facility] ENDO SUITE;  Service: Endoscopy;  Laterality: N/A;  8:30  . ESOPHAGOGASTRODUODENOSCOPY  01/23/01   Dr. [name] hiatal hernia, mild to moderate gastritis  . EXPLORATORY LAPAROTOMY     with lysis of adhesions  . 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 Authorizing Provider  aspirin 325 MG tablet Take 325 mg by mouth 2 (two) times daily.     [provider]  atorvastatin (LIPITOR) 20 MG tablet TAKE (1) TABLET BY MOUTH ONCE DAILY. 10/14/13   [name], MD  erythromycin ophthalmic ointment Place a 1/2 inch ribbon of ointment into the lower eyelid. 06/14/17   [name], [name], MD  HYDROcodone-acetaminophen (NORCO/VICODIN) 5-325 MG tablet Take 1 tablet by mouth every 6 (six) hours as needed. 06/14/17   [name], [name], MD  losartan (COZAAR) 25 MG tablet Take 25 mg by mouth daily.  11/12/15   [provider]  omeprazole (PRILOSEC) 20 MG capsule  TAKE 1 CAPSULE BY MOUTH ONCE A DAY. 08/08/13   [name], MD  traZODone (DESYREL) 50 MG tablet TAKE ONE TABLET BY MOUTH AT BEDTIME. 08/08/13   [name], MD    Family History Family History  Problem Relation Age of Onset  . Hyperlipidemia Mother   . Heart disease Mother   . Hypertension Mother   . Stroke Mother   . Heart disease Father        MI  . Colon cancer Neg Hx     Social History Social History   Tobacco Use  . Smoking status: Current Every Day Smoker    Packs/day: 0.50    Years: 50.00    Pack years: 25.00    Types: Cigarettes  . Smokeless tobacco: Never Used  . Tobacco comment: smokes about 4 cigarettes daily  Substance Use Topics    . Alcohol use: Yes    Comment: 2 beers daily x 40 yrs-rarely more  . Drug use: No     Allergies   Patient has no known allergies.   Review of Systems Review of Systems  Eyes: Positive for pain and redness. Negative for photophobia, discharge and visual disturbance.  Neurological: Negative for headaches.  All other systems reviewed and are negative.    Physical Exam Updated Vital Signs BP 131/72 (BP Location: Left Arm)   Pulse (!) 58   Temp 97.6 F (36.4 C) (Oral)   Resp 20   Ht 6\' 2"  (1.88 m)   Wt 95.7 kg (211 lb)   SpO2 95%   BMI 27.09 kg/m   Physical Exam  Constitutional: He is oriented to person, place, and time. He appears well-developed and well-nourished.  HENT:  Head: Normocephalic and atraumatic.  Eyes: EOM are normal. Pupils are equal, round, and reactive to light.  Pupils 4 mm and reactive bilaterally, mild injection of the right eye, pressure is 10, lid was inverted without evidence of foreign body, no obvious significant corneal abrasion on fluorescein testing  Cardiovascular: Normal rate and regular rhythm.  Pulmonary/Chest: Effort normal. No respiratory distress.  Musculoskeletal: He exhibits no edema.  Neurological: He is alert and oriented to person, place, and time.  Skin: Skin is warm and dry.  Psychiatric: He has a normal mood and affect.  Nursing note and vitals reviewed.    ED Treatments / Results  Labs (all labs ordered are listed, but only abnormal results are displayed) Labs Reviewed - No data to display  EKG  EKG Interpretation None       Radiology No results found.  Procedures Procedures (including critical care time)  Medications Ordered in ED Medications  tetracaine (PONTOCAINE) 0.5 % ophthalmic solution (  Given 06/14/17 0124)  fluorescein 1 MG ophthalmic strip (  Given 06/14/17 0127)  HYDROcodone-acetaminophen (NORCO/VICODIN) 5-325 MG per tablet 1 tablet (1 tablet Oral Given 06/14/17 0156)     Initial Impression /  Assessment and Plan / ED Course  I have reviewed the triage vital signs and the nursing notes.  Pertinent labs & imaging results that were available during my care of the patient were reviewed by me and considered in my medical decision making (see chart for details).     Patient presents with right eye pain.  He is overall nontoxic appearing.  His eye exam is remarkably reassuring without any significant evidence of corneal abrasion.  No noted foreign body.  Doubt glaucoma given normal pupillary response and normal eye pressure.  He denies any vision changes.  History is most suggestive of corneal abrasion.  We will treat presumptively.  Recommend close follow-up with eye doctor for formal ophthalmologic exam.  After history, exam, and medical workup I feel the patient has been appropriately medically screened and is safe for discharge home. Pertinent diagnoses were discussed with the patient. Patient was given return precautions.   Final Clinical Impressions(s) / ED Diagnoses   Final diagnoses:  Pain of right eye    ED Discharge Orders        Ordered    erythromycin ophthalmic ointment     06/14/17 0206    HYDROcodone-acetaminophen (NORCO/VICODIN) 5-325 MG tablet  Every 6 hours PRN     06/14/17 0206       Merryl Hacker, MD 06/14/17 [phone]

## 2017-06-14 NOTE — ED Triage Notes (Signed)
Pt c/o severe pain to right eye; pt states he went to bed at 2030 and he woke up at 2330 with pain to eye; pt's eye is red;

## 2017-06-16 ENCOUNTER — Other Ambulatory Visit: Payer: Self-pay

## 2017-07-10 DIAGNOSIS — M545 Low back pain: Secondary | ICD-10-CM | POA: Diagnosis not present

## 2017-08-22 DIAGNOSIS — J019 Acute sinusitis, unspecified: Secondary | ICD-10-CM | POA: Diagnosis not present

## 2017-08-22 DIAGNOSIS — I10 Essential (primary) hypertension: Secondary | ICD-10-CM | POA: Diagnosis not present

## 2017-08-22 DIAGNOSIS — M545 Low back pain: Secondary | ICD-10-CM | POA: Diagnosis not present

## 2017-08-22 DIAGNOSIS — R7301 Impaired fasting glucose: Secondary | ICD-10-CM | POA: Diagnosis not present

## 2017-08-22 DIAGNOSIS — Z125 Encounter for screening for malignant neoplasm of prostate: Secondary | ICD-10-CM | POA: Diagnosis not present

## 2017-08-25 DIAGNOSIS — E782 Mixed hyperlipidemia: Secondary | ICD-10-CM | POA: Diagnosis not present

## 2017-08-25 DIAGNOSIS — F5101 Primary insomnia: Secondary | ICD-10-CM | POA: Diagnosis not present

## 2017-08-25 DIAGNOSIS — Z0000 Encounter for general adult medical examination without abnormal findings: Secondary | ICD-10-CM | POA: Diagnosis not present

## 2017-08-25 DIAGNOSIS — R7301 Impaired fasting glucose: Secondary | ICD-10-CM | POA: Diagnosis not present

## 2017-08-25 DIAGNOSIS — I10 Essential (primary) hypertension: Secondary | ICD-10-CM | POA: Diagnosis not present

## 2018-03-05 DIAGNOSIS — I10 Essential (primary) hypertension: Secondary | ICD-10-CM | POA: Diagnosis not present

## 2018-03-05 DIAGNOSIS — E782 Mixed hyperlipidemia: Secondary | ICD-10-CM | POA: Diagnosis not present

## 2018-03-05 DIAGNOSIS — R7301 Impaired fasting glucose: Secondary | ICD-10-CM | POA: Diagnosis not present

## 2018-03-07 DIAGNOSIS — R7301 Impaired fasting glucose: Secondary | ICD-10-CM | POA: Diagnosis not present

## 2018-03-07 DIAGNOSIS — I10 Essential (primary) hypertension: Secondary | ICD-10-CM | POA: Diagnosis not present

## 2018-03-07 DIAGNOSIS — Z23 Encounter for immunization: Secondary | ICD-10-CM | POA: Diagnosis not present

## 2018-03-07 DIAGNOSIS — Z0000 Encounter for general adult medical examination without abnormal findings: Secondary | ICD-10-CM | POA: Diagnosis not present

## 2018-03-07 DIAGNOSIS — F5101 Primary insomnia: Secondary | ICD-10-CM | POA: Diagnosis not present

## 2018-03-07 DIAGNOSIS — E782 Mixed hyperlipidemia: Secondary | ICD-10-CM | POA: Diagnosis not present

## 2018-08-21 DIAGNOSIS — J069 Acute upper respiratory infection, unspecified: Secondary | ICD-10-CM | POA: Diagnosis not present

## 2018-09-13 DIAGNOSIS — I10 Essential (primary) hypertension: Secondary | ICD-10-CM | POA: Diagnosis not present

## 2018-09-13 DIAGNOSIS — E782 Mixed hyperlipidemia: Secondary | ICD-10-CM | POA: Diagnosis not present

## 2018-09-13 DIAGNOSIS — Z23 Encounter for immunization: Secondary | ICD-10-CM | POA: Diagnosis not present

## 2018-09-13 DIAGNOSIS — R7301 Impaired fasting glucose: Secondary | ICD-10-CM | POA: Diagnosis not present

## 2018-09-17 DIAGNOSIS — E782 Mixed hyperlipidemia: Secondary | ICD-10-CM | POA: Diagnosis not present

## 2018-09-17 DIAGNOSIS — R7301 Impaired fasting glucose: Secondary | ICD-10-CM | POA: Diagnosis not present

## 2018-09-17 DIAGNOSIS — I10 Essential (primary) hypertension: Secondary | ICD-10-CM | POA: Diagnosis not present

## 2018-09-17 DIAGNOSIS — G4700 Insomnia, unspecified: Secondary | ICD-10-CM | POA: Diagnosis not present

## 2018-10-08 DIAGNOSIS — Z0000 Encounter for general adult medical examination without abnormal findings: Secondary | ICD-10-CM | POA: Diagnosis not present

## 2018-10-10 ENCOUNTER — Other Ambulatory Visit: Payer: Self-pay

## 2018-10-10 NOTE — Patient Outreach (Signed)
[place] [facility]) Care Management  10/10/2018  ARYAN SPARKS [birth year] [id number]   Medication Adherence call to Mr. Pollyann Savoy patient did not answer pt is due on Atorvastatin 20 mg and Losartan 25 mg under [place].   [place] Management Direct Dial [phone]  Fax [phone] [email or web address]

## 2019-03-13 DIAGNOSIS — R7301 Impaired fasting glucose: Secondary | ICD-10-CM | POA: Diagnosis not present

## 2019-03-13 DIAGNOSIS — E782 Mixed hyperlipidemia: Secondary | ICD-10-CM | POA: Diagnosis not present

## 2019-03-13 DIAGNOSIS — I10 Essential (primary) hypertension: Secondary | ICD-10-CM | POA: Diagnosis not present

## 2019-03-18 DIAGNOSIS — I10 Essential (primary) hypertension: Secondary | ICD-10-CM | POA: Diagnosis not present

## 2019-03-18 DIAGNOSIS — E782 Mixed hyperlipidemia: Secondary | ICD-10-CM | POA: Diagnosis not present

## 2019-03-18 DIAGNOSIS — G4700 Insomnia, unspecified: Secondary | ICD-10-CM | POA: Diagnosis not present

## 2019-03-18 DIAGNOSIS — Z23 Encounter for immunization: Secondary | ICD-10-CM | POA: Diagnosis not present

## 2019-03-18 DIAGNOSIS — E1169 Type 2 diabetes mellitus with other specified complication: Secondary | ICD-10-CM | POA: Diagnosis not present

## 2019-05-08 DIAGNOSIS — G4700 Insomnia, unspecified: Secondary | ICD-10-CM | POA: Diagnosis not present

## 2019-05-08 DIAGNOSIS — E782 Mixed hyperlipidemia: Secondary | ICD-10-CM | POA: Diagnosis not present

## 2019-05-08 DIAGNOSIS — I10 Essential (primary) hypertension: Secondary | ICD-10-CM | POA: Diagnosis not present

## 2019-05-08 DIAGNOSIS — E1169 Type 2 diabetes mellitus with other specified complication: Secondary | ICD-10-CM | POA: Diagnosis not present

## 2019-05-22 DIAGNOSIS — E1169 Type 2 diabetes mellitus with other specified complication: Secondary | ICD-10-CM | POA: Diagnosis not present

## 2019-05-22 DIAGNOSIS — G4700 Insomnia, unspecified: Secondary | ICD-10-CM | POA: Diagnosis not present

## 2019-05-22 DIAGNOSIS — M542 Cervicalgia: Secondary | ICD-10-CM | POA: Diagnosis not present

## 2019-05-22 DIAGNOSIS — E782 Mixed hyperlipidemia: Secondary | ICD-10-CM | POA: Diagnosis not present

## 2019-05-22 DIAGNOSIS — I10 Essential (primary) hypertension: Secondary | ICD-10-CM | POA: Diagnosis not present

## 2019-06-27 DIAGNOSIS — R7301 Impaired fasting glucose: Secondary | ICD-10-CM | POA: Diagnosis not present

## 2019-06-27 DIAGNOSIS — I10 Essential (primary) hypertension: Secondary | ICD-10-CM | POA: Diagnosis not present

## 2019-06-27 DIAGNOSIS — E1169 Type 2 diabetes mellitus with other specified complication: Secondary | ICD-10-CM | POA: Diagnosis not present

## 2019-06-27 DIAGNOSIS — E782 Mixed hyperlipidemia: Secondary | ICD-10-CM | POA: Diagnosis not present

## 2019-07-02 DIAGNOSIS — I10 Essential (primary) hypertension: Secondary | ICD-10-CM | POA: Diagnosis not present

## 2019-07-02 DIAGNOSIS — E1169 Type 2 diabetes mellitus with other specified complication: Secondary | ICD-10-CM | POA: Diagnosis not present

## 2019-07-02 DIAGNOSIS — E782 Mixed hyperlipidemia: Secondary | ICD-10-CM | POA: Diagnosis not present

## 2019-07-02 DIAGNOSIS — G4700 Insomnia, unspecified: Secondary | ICD-10-CM | POA: Diagnosis not present

## 2019-07-03 DIAGNOSIS — E7849 Other hyperlipidemia: Secondary | ICD-10-CM | POA: Diagnosis not present

## 2019-07-03 DIAGNOSIS — E1169 Type 2 diabetes mellitus with other specified complication: Secondary | ICD-10-CM | POA: Diagnosis not present

## 2019-07-03 DIAGNOSIS — G4700 Insomnia, unspecified: Secondary | ICD-10-CM | POA: Diagnosis not present

## 2019-07-03 DIAGNOSIS — I10 Essential (primary) hypertension: Secondary | ICD-10-CM | POA: Diagnosis not present

## 2019-07-11 ENCOUNTER — Other Ambulatory Visit: Payer: Self-pay

## 2019-07-11 NOTE — Patient Outreach (Signed)
[place] [facility]) Care Management  07/11/2019  MATHAYUS HEINIG 1946/04/24 [id number]   Medication Adherence call to Mr.Pollyann Savoy Telephone call to Patient regarding Medication Adherence unable to reach patient. Patient did not answer patient is due on Atorvastatin 20 mg under [place].   [place] Management Direct Dial [phone]  Fax [phone] [email or web address]

## 2019-09-24 DIAGNOSIS — A084 Viral intestinal infection, unspecified: Secondary | ICD-10-CM | POA: Diagnosis not present

## 2019-09-24 DIAGNOSIS — R197 Diarrhea, unspecified: Secondary | ICD-10-CM | POA: Diagnosis not present

## 2019-09-28 ENCOUNTER — Other Ambulatory Visit: Payer: Self-pay

## 2019-09-28 ENCOUNTER — Encounter (HOSPITAL_COMMUNITY): Payer: Self-pay | Admitting: Emergency Medicine

## 2019-09-28 ENCOUNTER — Emergency Department (HOSPITAL_COMMUNITY): Payer: Medicare Other

## 2019-09-28 ENCOUNTER — Inpatient Hospital Stay (HOSPITAL_COMMUNITY)
Admission: EM | Admit: 2019-09-28 | Discharge: 2019-10-02 | DRG: 389 | Disposition: A | Discharge status: Home / Self Care | Payer: Medicare Other | Attending: Family Medicine | Admitting: Family Medicine

## 2019-09-28 DIAGNOSIS — R110 Nausea: Secondary | ICD-10-CM

## 2019-09-28 DIAGNOSIS — R1115 Cyclical vomiting syndrome unrelated to migraine: Secondary | ICD-10-CM | POA: Diagnosis present

## 2019-09-28 DIAGNOSIS — A09 Infectious gastroenteritis and colitis, unspecified: Secondary | ICD-10-CM | POA: Diagnosis not present

## 2019-09-28 DIAGNOSIS — J449 Chronic obstructive pulmonary disease, unspecified: Secondary | ICD-10-CM | POA: Diagnosis present

## 2019-09-28 DIAGNOSIS — R109 Unspecified abdominal pain: Secondary | ICD-10-CM | POA: Diagnosis not present

## 2019-09-28 DIAGNOSIS — Z8673 Personal history of transient ischemic attack (TIA), and cerebral infarction without residual deficits: Secondary | ICD-10-CM | POA: Diagnosis not present

## 2019-09-28 DIAGNOSIS — Z20822 Contact with and (suspected) exposure to covid-19: Secondary | ICD-10-CM | POA: Diagnosis not present

## 2019-09-28 DIAGNOSIS — I10 Essential (primary) hypertension: Secondary | ICD-10-CM | POA: Diagnosis present

## 2019-09-28 DIAGNOSIS — E860 Dehydration: Secondary | ICD-10-CM | POA: Diagnosis not present

## 2019-09-28 DIAGNOSIS — R197 Diarrhea, unspecified: Secondary | ICD-10-CM | POA: Diagnosis present

## 2019-09-28 DIAGNOSIS — Z7984 Long term (current) use of oral hypoglycemic drugs: Secondary | ICD-10-CM

## 2019-09-28 DIAGNOSIS — E785 Hyperlipidemia, unspecified: Secondary | ICD-10-CM | POA: Diagnosis not present

## 2019-09-28 DIAGNOSIS — Z823 Family history of stroke: Secondary | ICD-10-CM

## 2019-09-28 DIAGNOSIS — Z79899 Other long term (current) drug therapy: Secondary | ICD-10-CM | POA: Diagnosis not present

## 2019-09-28 DIAGNOSIS — R1110 Vomiting, unspecified: Secondary | ICD-10-CM | POA: Diagnosis not present

## 2019-09-28 DIAGNOSIS — Z8719 Personal history of other diseases of the digestive system: Secondary | ICD-10-CM

## 2019-09-28 DIAGNOSIS — K219 Gastro-esophageal reflux disease without esophagitis: Secondary | ICD-10-CM | POA: Diagnosis present

## 2019-09-28 DIAGNOSIS — Z83438 Family history of other disorder of lipoprotein metabolism and other lipidemia: Secondary | ICD-10-CM

## 2019-09-28 DIAGNOSIS — K5651 Intestinal adhesions [bands], with partial obstruction: Secondary | ICD-10-CM | POA: Diagnosis not present

## 2019-09-28 DIAGNOSIS — Z8249 Family history of ischemic heart disease and other diseases of the circulatory system: Secondary | ICD-10-CM

## 2019-09-28 DIAGNOSIS — E119 Type 2 diabetes mellitus without complications: Secondary | ICD-10-CM | POA: Diagnosis present

## 2019-09-28 DIAGNOSIS — Z9081 Acquired absence of spleen: Secondary | ICD-10-CM

## 2019-09-28 DIAGNOSIS — K56609 Unspecified intestinal obstruction, unspecified as to partial versus complete obstruction: Secondary | ICD-10-CM | POA: Diagnosis not present

## 2019-09-28 DIAGNOSIS — N179 Acute kidney failure, unspecified: Secondary | ICD-10-CM | POA: Diagnosis not present

## 2019-09-28 DIAGNOSIS — D72829 Elevated white blood cell count, unspecified: Secondary | ICD-10-CM | POA: Diagnosis not present

## 2019-09-28 DIAGNOSIS — Z7982 Long term (current) use of aspirin: Secondary | ICD-10-CM

## 2019-09-28 DIAGNOSIS — F17210 Nicotine dependence, cigarettes, uncomplicated: Secondary | ICD-10-CM | POA: Diagnosis present

## 2019-09-28 DIAGNOSIS — Z9049 Acquired absence of other specified parts of digestive tract: Secondary | ICD-10-CM

## 2019-09-28 DIAGNOSIS — Z0189 Encounter for other specified special examinations: Secondary | ICD-10-CM

## 2019-09-28 DIAGNOSIS — K6389 Other specified diseases of intestine: Secondary | ICD-10-CM | POA: Diagnosis not present

## 2019-09-28 DIAGNOSIS — K56699 Other intestinal obstruction unspecified as to partial versus complete obstruction: Secondary | ICD-10-CM | POA: Diagnosis not present

## 2019-09-28 HISTORY — DX: Type 2 diabetes mellitus without complications: E11.9

## 2019-09-28 LAB — CBC WITH DIFFERENTIAL/PLATELET
Abs Immature Granulocytes: 0.24 10*3/uL — ABNORMAL HIGH (ref 0.00–0.07)
Basophils Absolute: 0.1 10*3/uL (ref 0.0–0.1)
Basophils Relative: 1 %
Eosinophils Absolute: 0.2 10*3/uL (ref 0.0–0.5)
Eosinophils Relative: 1 %
HCT: 48.1 % (ref 39.0–52.0)
Hemoglobin: 16.4 g/dL (ref 13.0–17.0)
Immature Granulocytes: 2 %
Lymphocytes Relative: 22 %
Lymphs Abs: 2.5 10*3/uL (ref 0.7–4.0)
MCH: 31.4 pg (ref 26.0–34.0)
MCHC: 34.1 g/dL (ref 30.0–36.0)
MCV: 92 fL (ref 80.0–100.0)
Monocytes Absolute: 2.5 10*3/uL — ABNORMAL HIGH (ref 0.1–1.0)
Monocytes Relative: 22 %
Neutro Abs: 5.7 10*3/uL (ref 1.7–7.7)
Neutrophils Relative %: 52 %
Platelets: 370 10*3/uL (ref 150–400)
RBC: 5.23 MIL/uL (ref 4.22–5.81)
RDW: 13.3 % (ref 11.5–15.5)
WBC: 11.2 10*3/uL — ABNORMAL HIGH (ref 4.0–10.5)
nRBC: 0 % (ref 0.0–0.2)

## 2019-09-28 LAB — COMPREHENSIVE METABOLIC PANEL
ALT: 19 U/L (ref 0–44)
AST: 15 U/L (ref 15–41)
Albumin: 3.5 g/dL (ref 3.5–5.0)
Alkaline Phosphatase: 45 U/L (ref 38–126)
Anion gap: 13 (ref 5–15)
BUN: 52 mg/dL — ABNORMAL HIGH (ref 8–23)
CO2: 20 mmol/L — ABNORMAL LOW (ref 22–32)
Calcium: 8.4 mg/dL — ABNORMAL LOW (ref 8.9–10.3)
Chloride: 98 mmol/L (ref 98–111)
Creatinine, Ser: 1.89 mg/dL — ABNORMAL HIGH (ref 0.61–1.24)
GFR calc Af Amer: 40 mL/min — ABNORMAL LOW (ref >60)
GFR calc non Af Amer: 34 mL/min — ABNORMAL LOW (ref >60)
Glucose, Bld: 120 mg/dL — ABNORMAL HIGH (ref 70–99)
Potassium: 3.9 mmol/L (ref 3.5–5.1)
Sodium: 131 mmol/L — ABNORMAL LOW (ref 135–145)
Total Bilirubin: 0.9 mg/dL (ref 0.3–1.2)
Total Protein: 7.5 g/dL (ref 6.5–8.1)

## 2019-09-28 LAB — RESPIRATORY PANEL BY RT PCR (FLU A&B, COVID)
Influenza A by PCR: NEGATIVE
Influenza B by PCR: NEGATIVE
SARS Coronavirus 2 by RT PCR: NEGATIVE

## 2019-09-28 LAB — GLUCOSE, CAPILLARY: Glucose-Capillary: 84 mg/dL (ref 70–99)

## 2019-09-28 LAB — LACTIC ACID, PLASMA: Lactic Acid, Venous: 1.4 mmol/L (ref 0.5–1.9)

## 2019-09-28 MED ORDER — ONDANSETRON HCL 4 MG/2ML IJ SOLN: 4.0000 mg | Freq: Four times a day (QID) | INTRAMUSCULAR | Status: DC | PRN

## 2019-09-28 MED ORDER — SODIUM CHLORIDE 0.9 % IV SOLN: INTRAVENOUS | Status: DC

## 2019-09-28 MED ORDER — IOHEXOL 300 MG/ML  SOLN
75.0000 mL | Freq: Once | INTRAMUSCULAR | Status: AC | PRN
  Administered 2019-09-28: 09:00:00 75 mL via INTRAVENOUS

## 2019-09-28 MED ORDER — SODIUM CHLORIDE 0.9 % IV BOLUS
2000.0000 mL | Freq: Once | INTRAVENOUS | Status: AC
  Administered 2019-09-28: 2000 mL via INTRAVENOUS

## 2019-09-28 MED ORDER — HEPARIN SODIUM (PORCINE) 5000 UNIT/ML IJ SOLN
5000.0000 [IU] | Freq: Three times a day (TID) | INTRAMUSCULAR | Status: DC
  Administered 2019-09-28 – 2019-10-02 (×10): 5000 [IU] via SUBCUTANEOUS
  Filled 2019-09-28 (×11): qty 1

## 2019-09-28 MED ORDER — ASPIRIN 325 MG PO TABS
325.0000 mg | ORAL_TABLET | Freq: Every day | ORAL | Status: DC
  Administered 2019-09-29 – 2019-10-02 (×4): 325 mg via ORAL
  Filled 2019-09-28 (×4): qty 1

## 2019-09-28 MED ORDER — TRAZODONE HCL 50 MG PO TABS
50.0000 mg | ORAL_TABLET | Freq: Every day | ORAL | Status: DC
  Administered 2019-09-28 – 2019-10-01 (×3): 50 mg via ORAL
  Filled 2019-09-28 (×4): qty 1

## 2019-09-28 MED ORDER — POLYETHYLENE GLYCOL 3350 17 G PO PACK: 17.0000 g | PACK | Freq: Every day | ORAL | Status: DC | PRN

## 2019-09-28 MED ORDER — TRAZODONE HCL 50 MG PO TABS: 50.0000 mg | ORAL_TABLET | Freq: Every evening | ORAL | Status: DC | PRN

## 2019-09-28 MED ORDER — ASPIRIN 300 MG RE SUPP: 300.0000 mg | Freq: Every day | RECTAL | Status: DC

## 2019-09-28 MED ORDER — SODIUM CHLORIDE 0.9% FLUSH: 3.0000 mL | INTRAVENOUS | Status: DC | PRN

## 2019-09-28 MED ORDER — SODIUM CHLORIDE 0.9% FLUSH
3.0000 mL | Freq: Two times a day (BID) | INTRAVENOUS | Status: DC
  Administered 2019-09-28 – 2019-10-01 (×7): 3 mL via INTRAVENOUS

## 2019-09-28 MED ORDER — ONDANSETRON HCL 4 MG/2ML IJ SOLN
4.0000 mg | Freq: Once | INTRAMUSCULAR | Status: AC
  Administered 2019-09-28: 4 mg via INTRAVENOUS
  Filled 2019-09-28: qty 2

## 2019-09-28 MED ORDER — KETOROLAC TROMETHAMINE 30 MG/ML IJ SOLN
15.0000 mg | Freq: Once | INTRAMUSCULAR | Status: AC
  Administered 2019-09-28: 15 mg via INTRAVENOUS
  Filled 2019-09-28: qty 1

## 2019-09-28 MED ORDER — MORPHINE SULFATE (PF) 2 MG/ML IV SOLN: 2.0000 mg | INTRAVENOUS | Status: DC | PRN

## 2019-09-28 MED ORDER — NICOTINE 14 MG/24HR TD PT24
14.0000 mg | MEDICATED_PATCH | Freq: Every day | TRANSDERMAL | Status: DC
  Filled 2019-09-28 (×2): qty 1

## 2019-09-28 MED ORDER — ATORVASTATIN CALCIUM 20 MG PO TABS
20.0000 mg | ORAL_TABLET | Freq: Every evening | ORAL | Status: DC
  Administered 2019-09-28 – 2019-10-01 (×3): 20 mg via ORAL
  Filled 2019-09-28 (×3): qty 1

## 2019-09-28 MED ORDER — SODIUM CHLORIDE 0.9 % IV SOLN: 250.0000 mL | INTRAVENOUS | Status: DC | PRN

## 2019-09-28 MED ORDER — ONDANSETRON HCL 4 MG PO TABS: 4.0000 mg | ORAL_TABLET | Freq: Four times a day (QID) | ORAL | Status: DC | PRN

## 2019-09-28 MED ORDER — SODIUM CHLORIDE 0.9 % IV BOLUS
1000.0000 mL | Freq: Once | INTRAVENOUS | Status: AC
  Administered 2019-09-28: 1000 mL via INTRAVENOUS

## 2019-09-28 NOTE — ED Provider Notes (Signed)
[facility] EMERGENCY DEPARTMENT Provider Note   CSN: [id number] Arrival date & time: 09/28/19  [id number]     History Chief Complaint  Patient presents with  . Emesis    Andre [name] is a 74 y.o. male.  Patient complains of vomiting and diarrhea for few days.  Many of his relatives had.  Patient feels dehydrated but does not have discomfort now  The history is provided by the patient. No language interpreter was used.  Emesis Severity:  Moderate Timing:  Constant Quality:  Bilious material Able to tolerate:  Liquids Progression:  Improving Chronicity:  New Recent urination:  Normal Context: not post-tussive   Relieved by:  Nothing Worsened by:  Nothing Ineffective treatments:  None tried Associated symptoms: abdominal pain   Associated symptoms: no cough, no diarrhea and no headaches        Past Medical History:  Diagnosis Date  . Adenomatous polyp 2002   tcs by [name]  . Diabetes mellitus without complication ([place])   . Diverticula of colon 2002   L side  . GERD (gastroesophageal reflux disease)   . Hemorrhoids 2002   internal and external  . Hiatal hernia 2002   moderate  . Hyperlipidemia   . Hypertension   . Mini stroke ([place]) 1998   no deficits  . Sleep apnea    does not use CPAP. PCP aware.    Patient Active Problem List   Diagnosis Date Noted  . SBO (small bowel obstruction) ([place]) 09/28/2019  . Memory change 11/01/2012  . OSA (obstructive sleep apnea) 05/02/2012  . Hx of adenomatous colonic polyps 12/02/2011  . Routine general medical examination at a health care facility 11/04/2011  . Decreased visual acuity 11/04/2011  . Hyperlipidemia 11/04/2011  . Tobacco use 09/21/2011  . History of TIAs 09/20/2011  . COPD (chronic obstructive pulmonary disease) ([place]) 09/20/2011  . Insomnia 09/20/2011  . Colon polyp 09/20/2011    Past Surgical History:  Procedure Laterality Date  . CATARACT EXTRACTION W/PHACO Left 12/31/2015   Procedure: CATARACT  EXTRACTION PHACO AND INTRAOCULAR LENS PLACEMENT LEFT EYE; CDE: 17.28;  Surgeon: [name], MD;  Location: [facility] OR[facility];  Service: Ophthalmology;  Laterality: Left;  . CHOLECYSTECTOMY  1990   [name]  . COLONOSCOPY  01/23/01   [name]- small internal and external hemorrhoids, L sided mild diverticula. adenomatous polyp removed from rectum.  . COLONOSCOPY  12/26/2011   Procedure: COLONOSCOPY;  Surgeon: [name], MD;  Location: [facility] END[facility];  Service: Endoscopy;  Laterality: N/A;  8:30  . ESOPHAGOGASTRODUODENOSCOPY  01/23/01   Dr. [name] hiatal hernia, mild to moderate gastritis  . EXPLORATORY LAPAROTOMY     with lysis of adhesions  . 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 Hyperlipidemia Mother   . Heart disease Mother   . Hypertension Mother   . Stroke Mother   . Heart disease Father        MI  . Colon cancer Neg Hx     Social History   Tobacco Use  . Smoking status: Current Every Day Smoker    Packs/day: 0.50    Years: 50.00    Pack years: 25.00    Types: Cigarettes  . Smokeless tobacco: Never Used  . Tobacco comment: smokes about 4 cigarettes daily  Substance Use Topics  . Alcohol use: Yes    Comment: 2 beers daily x 40 yrs-rarely more  .  Drug use: No    Home Medications Prior to Admission medications   Medication Sig Start Date End Date Taking? Authorizing Provider  aspirin 325 MG tablet Take 325 mg by mouth daily.    Yes [provider]  atorvastatin (LIPITOR) 20 MG tablet TAKE (1) TABLET BY MOUTH ONCE DAILY. Patient taking differently: Take 20 mg by mouth daily.  10/14/13  Yes [place], Modena Nunnery, MD  diphenoxylate-atropine (LOMOTIL) 2.5-0.025 MG tablet Take 1 tablet by mouth in the morning and at bedtime.   Yes [provider]  JANUVIA 100 MG tablet Take 100 mg by mouth daily. 08/30/19  Yes [provider]  losartan (COZAAR) 25 MG tablet Take 25 mg by mouth daily.  11/12/15   Yes [provider]  losartan (COZAAR) 50 MG tablet Take 50 mg by mouth daily. 09/17/19  Yes [provider]  metFORMIN (GLUCOPHAGE) 500 MG tablet Take 500 mg by mouth 2 (two) times daily. 09/16/19  Yes [provider]  omeprazole (PRILOSEC) 20 MG capsule TAKE 1 CAPSULE BY MOUTH ONCE A DAY. 08/08/13  Yes [place], Modena Nunnery, MD  ondansetron (ZOFRAN) 4 MG tablet Take 4 mg by mouth every 6 (six) hours as needed for nausea or vomiting.   Yes [provider]  erythromycin ophthalmic ointment Place a 1/2 inch ribbon of ointment into the lower eyelid. Patient not taking: Reported on 09/28/2019 06/14/17   [name], [name], MD  HYDROcodone-acetaminophen (NORCO/VICODIN) 5-325 MG tablet Take 1 tablet by mouth every 6 (six) hours as needed. Patient not taking: Reported on 09/28/2019 06/14/17   [name], [name], MD  traZODone (DESYREL) 50 MG tablet TAKE ONE TABLET BY MOUTH AT BEDTIME. Patient not taking: Reported on 09/28/2019 08/08/13   [name], MD    Allergies    Patient has no known allergies.  Review of Systems   Review of Systems  Constitutional: Negative for appetite change and fatigue.  HENT: Negative for congestion, ear discharge and sinus pressure.   Eyes: Negative for discharge.  Respiratory: Negative for cough.   Cardiovascular: Negative for chest pain.  Gastrointestinal: Positive for abdominal pain and vomiting. Negative for diarrhea.  Genitourinary: Negative for frequency and hematuria.  Musculoskeletal: Negative for back pain.  Skin: Negative for rash.  Neurological: Negative for seizures and headaches.  Psychiatric/Behavioral: Negative for hallucinations.    Physical Exam Updated Vital Signs BP 104/69   Pulse 72   Temp 98.1 F (36.7 C) (Oral)   Resp 18   Ht 6\' 2"  (1.88 m)   Wt 95.3 kg   SpO2 97%   BMI 26.96 kg/m   Physical Exam Vitals reviewed.  Constitutional:      Appearance: He is well-developed.  HENT:     Head: Normocephalic.      Nose: Nose normal.  Eyes:     General: No scleral icterus.    Conjunctiva/sclera: Conjunctivae normal.  Neck:     Thyroid: No thyromegaly.  Cardiovascular:     Rate and Rhythm: Normal rate and regular rhythm.     Heart sounds: No murmur. No friction rub. No gallop.   Pulmonary:     Breath sounds: No stridor. No wheezing or rales.  Chest:     Chest wall: No tenderness.  Abdominal:     General: There is no distension.     Tenderness: There is no abdominal tenderness. There is no rebound.  Musculoskeletal:        General: Normal range of motion.  Cervical back: Neck supple.  Lymphadenopathy:     Cervical: No cervical adenopathy.  Skin:    Findings: No erythema or rash.  Neurological:     Mental Status: He is alert and oriented to person, place, and time.     Motor: No abnormal muscle tone.     Coordination: Coordination normal.  Psychiatric:        Behavior: Behavior normal.     ED Results / Procedures / Treatments   Labs (all labs ordered are listed, but only abnormal results are displayed) Labs Reviewed  CBC WITH DIFFERENTIAL/PLATELET - Abnormal; Notable for the following components:      Result Value   WBC 11.2 (*)    Monocytes Absolute 2.5 (*)    Abs Immature Granulocytes 0.24 (*)    All other components within normal limits  COMPREHENSIVE METABOLIC PANEL - Abnormal; Notable for the following components:   Sodium 131 (*)    CO2 20 (*)    Glucose, Bld 120 (*)    BUN 52 (*)    Creatinine, Ser 1.89 (*)    Calcium 8.4 (*)    GFR calc non Af Amer 34 (*)    GFR calc Af Amer 40 (*)    All other components within normal limits  CULTURE, BLOOD (ROUTINE X 2)  CULTURE, BLOOD (ROUTINE X 2)  RESPIRATORY PANEL BY RT PCR (FLU A&B, COVID)  LACTIC ACID, PLASMA    EKG None  Radiology CT ABDOMEN PELVIS W CONTRAST  Result Date: 09/28/2019 CLINICAL DATA:  74 year old male with abdominal and pelvic pain with diarrhea for 6 days. Vomiting for 3 days. EXAM: CT ABDOMEN  AND PELVIS WITH CONTRAST TECHNIQUE: Multidetector CT imaging of the abdomen and pelvis was performed using the standard protocol following bolus administration of intravenous contrast. CONTRAST:  4mL OMNIPAQUE IOHEXOL 300 MG/ML  SOLN COMPARISON:  None. FINDINGS: Lower chest: No acute abnormality. Hepatobiliary: The patient is status post cholecystectomy. Hepatic steatosis is identified without focal hepatic lesion. No biliary dilatation. Pancreas: Mildly atrophic without other abnormality. 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 Adrenals/Urinary Tract: The kidneys, adrenal glands and bladder are unremarkable except for a probable small LEFT renal cyst. Stomach/Bowel: Distended proximal mid small bowel loops are noted with nondistended but gas and fluid-filled distal loops of small bowel. Gas and fluid in the colon noted. There is a relative transition point within the anterior pelvis (series 2: Image 66) likely representing an partial small bowel obstruction, probably from an adhesion. Colonic diverticulosis noted without evidence of diverticulitis. No bowel wall thickening or pneumoperitoneum. What appears to be the appendix is normal. Vascular/Lymphatic: Aortic atherosclerosis. No enlarged abdominal or pelvic lymph nodes. Reproductive: Mildly prominent prostate noted. Other: No ascites or focal collection/abscess. Musculoskeletal: No acute or suspicious bony abnormalities are noted. Mild degenerative changes in the lumbar spine are present. IMPRESSION: 1. Distended proximal and mid small bowel loops with relative transition point within the anterior pelvis, likely representing a partial small bowel obstruction, probably from an adhesion. No ascites, pneumoperitoneum or abscess. 2. Hepatic steatosis. 3. Aortic Atherosclerosis (ICD10-I70.0). Electronically Signed   By: Margarette Canada M.D.   On: 09/28/2019 10:15    Procedures Procedures (including critical care time)  Medications  Ordered in ED Medications  sodium chloride 0.9 % bolus 2,000 mL (0 mLs Intravenous Stopped 09/28/19 0950)  ondansetron (ZOFRAN) injection 4 mg (4 mg Intravenous Given 09/28/19 0735)  ketorolac (TORADOL) 30 MG/ML injection 15 mg (15 mg Intravenous Given 09/28/19 0735)  iohexol (OMNIPAQUE) 300 MG/ML solution 75 mL (75 mLs Intravenous Contrast Given 09/28/19 0923)  sodium chloride 0.9 % bolus 1,000 mL (0 mLs Intravenous Stopped 09/28/19 1307)    ED Course  I have reviewed the triage vital signs and the nursing notes.  Pertinent labs & imaging results that were available during my care of the patient were reviewed by me and considered in my medical decision making (see chart for details).    MDM Rules/Calculators/A&P                      X-ray shows possible small bowel obstruction.  Patient's symptoms improved.  I spoke with general surgery and they stated to not put an NG tube and patient can have liquids.  he will be admitted to medicine   This patient presents to the ED for concern of vomiting, this involves an extensive number of treatment options, and is a complaint that carries with it a high risk of complications and morbidity.  The differential diagnosis includes small bowel obstruction viral syndrome   Lab Tests:   I Ordered, reviewed, and interpreted labs, which included   Medicines ordered:   I ordered medication IV fluids and Zofran for dehydration and nausea  Imaging Studies ordered:   I ordered imaging studies which included CT of the abdomen and  I independently visualized and interpreted imaging which showed possible partial small bowel obstruction  Additional history obtained:   Additional history obtained from family member  Previous records obtained and reviewed   Consultations Obtained:   I consulted general surgery and internal medicine and discussed lab and imaging findings  Reevaluation:  After the interventions stated above, I reevaluated the  patient and found pt improved  Critical Interventions:  .  Final Clinical Impression(s) / ED Diagnoses Final diagnoses:  Dehydration    Rx / DC Orders ED Discharge Orders    None       Milton Ferguson, MD 09/28/19 1426

## 2019-09-28 NOTE — ED Triage Notes (Signed)
Pt c/o N/V/D since 09/23/19. Pt states he hasn't been able to eat or drink since.

## 2019-09-28 NOTE — H&P (Signed)
Patient Demographics:    Andre [name], is a 74 y.o. male  MRN: [id number]   DOB - Jul 17, 1945  Admit Date - 09/28/2019  Outpatient Primary MD for the patient is Celene Squibb, MD   Assessment & Plan:    Principal Problem:   SBO (small bowel obstruction) ([place]) Active Problems:   History of TIAs   COPD (chronic obstructive pulmonary disease) (H[facility])   AKI (acute kidney injury) ([place])   Dehydration   Emesis, persistent    1)PSBO Vs Ileus--- no further emesis, having loose stools after contrast, as per Gen Surgeon (Dr. Arnoldo Morale) , no need for NG tube at this time, okay to try liquid diet -IV fluids until oral intake more reliable -CT abdomen and pelvis with possible pSBO secondary to adhesions with transition point in the pelvis -WBC is 11.2, lactic acid is not elevated  2)AKI----acute kidney injury--- worsening renal function due to dehydration secondary to GI losses with vomiting or diarrhea PTA  --creatinine on admission= 1.89 , baseline creatinine =  0.9 to 1.0  , --, renally adjust medications, avoid nephrotoxic agents / dehydration  / hypotension -Hold Metformin, hold losartan  3)Intractable emesis--- improving, see #1 above, as needed Zofran  4)DM2-as per patient previously well controlled,  --hold Metformin and Januvia, Allow some permissive Hyperglycemia rather than risk life-threatening hypoglycemia in a patient with unreliable oral intake.  -Check Accu-Cheks/Fingersticks as ordered  5) history of COPD/tobacco abuse--stable, no acute exacerbation at this time --Not interested in smoking cessation  6) prior history of TIA--continue Lipitor and aspirin   With History of - Reviewed by me  Past Medical History:  Diagnosis Date  . Adenomatous polyp 2002   tcs by [name]  . Diabetes mellitus  without complication ([place])   . Diverticula of colon 2002   L side  . GERD (gastroesophageal reflux disease)   . Hemorrhoids 2002   internal and external  . Hiatal hernia 2002   moderate  . Hyperlipidemia   . Hypertension   . Mini stroke ([place]) 1998   no deficits  . Sleep apnea    does not use CPAP. PCP aware.      Past Surgical History:  Procedure Laterality Date  . CATARACT EXTRACTION W/PHACO Left 12/31/2015   Procedure: CATARACT EXTRACTION PHACO AND INTRAOCULAR LENS PLACEMENT LEFT EYE; CDE: 17.28;  Surgeon: [name], MD;  Location: [facility];  Service: Ophthalmology;  Laterality: Left;  . CHOLECYSTECTOMY  1990   [name]  . COLONOSCOPY  01/23/01   [name]- small internal and external hemorrhoids, L sided mild diverticula. adenomatous polyp removed from rectum.  . COLONOSCOPY  12/26/2011   Procedure: COLONOSCOPY;  Surgeon: [name], MD;  Location: [facility];  Service: Endoscopy;  Laterality: N/A;  8:30  . ESOPHAGOGASTRODUODENOSCOPY  01/23/01   Dr. [name] hiatal hernia, mild to moderate gastritis  . EXPLORATORY LAPAROTOMY     with lysis of adhesions  .  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 Emesis      HPI:    Andre [name]  is a 74 y.o. male with past medical history relevant for COPD, prior episodes of bowel obstruction due to adhesions, DM2, tobacco abuse, prior history of TIA, status post multiple laparotomies in the past including cholecystectomy in the early 1990s, laparotomy back and not [place], and surgery for SBO secondary to adhesions more than 25 years ago presents to the ED with intractable emesis since 09/23/2019--at the advice of his PCP patient was drinking Gatorade and taking Imodium since 09/25/2019 -Feels dry, weak and tired -CT in the ED consistent with possible small bowel obstruction versus ileus with transition point in the pelvic area -EDP discussed case with general surgeon who  recommended no NG tube at this time and okay to give liquid diet -WBC was 11.2 lactic acid was 1.4, creatinine was up to 1.89 from a baseline around 1  -Emesis was apparently non-bloody, patient started to have multiple BMs after contrast study today - Patient requesting to be fed, says she is very hungry and dehydrated =-No chest pains palpitations dizziness or shortness of breath -No leg pains or pleuritic symptoms  -CT abdomen and pelvis IMPRESSION: 1. Distended proximal and mid small bowel loops with relative transition point within the anterior pelvis, likely representing a partial small bowel obstruction, probably from an adhesion. No ascites, pneumoperitoneum or abscess.   Review of systems:    In addition to the HPI above,   A full Review of  Systems was done, all other systems reviewed are negative except as noted above in HPI , .    Social History:  Reviewed by me    Social History   Tobacco Use  . Smoking status: Current Every Day Smoker    Packs/day: 0.50    Years: 50.00    Pack years: 25.00    Types: Cigarettes  . Smokeless tobacco: Never Used  . Tobacco comment: smokes about 4 cigarettes daily  Substance Use Topics  . Alcohol use: Yes    Comment: 2 beers daily x 40 yrs-rarely more       Family History :  Reviewed by me    Family History  Problem Relation Age of Onset  . Hyperlipidemia Mother   . Heart disease Mother   . Hypertension Mother   . Stroke Mother   . Heart disease Father        MI  . Colon cancer Neg Hx      Home Medications:   Prior to Admission medications   Medication Sig Start Date End Date Taking? Authorizing Provider  aspirin 325 MG tablet Take 325 mg by mouth daily.    Yes [provider]  atorvastatin (LIPITOR) 20 MG tablet TAKE (1) TABLET BY MOUTH ONCE DAILY. Patient taking differently: Take 20 mg by mouth daily.  10/14/13  Yes [place], Modena Nunnery, MD  diphenoxylate-atropine (LOMOTIL) 2.5-0.025 MG tablet Take 1  tablet by mouth in the morning and at bedtime.   Yes [provider]  JANUVIA 100 MG tablet Take 100 mg by mouth daily. 08/30/19  Yes [provider]  losartan (COZAAR) 25 MG tablet Take 25 mg by mouth daily.  11/12/15  Yes [provider]  losartan (COZAAR) 50 MG tablet Take 50 mg by mouth daily. 09/17/19  Yes [provider]  metFORMIN (GLUCOPHAGE) 500 MG tablet Take 500 mg by  mouth 2 (two) times daily. 09/16/19  Yes [provider]  omeprazole (PRILOSEC) 20 MG capsule TAKE 1 CAPSULE BY MOUTH ONCE A DAY. 08/08/13  Yes [place], Modena Nunnery, MD  ondansetron (ZOFRAN) 4 MG tablet Take 4 mg by mouth every 6 (six) hours as needed for nausea or vomiting.   Yes [provider]  erythromycin ophthalmic ointment Place a 1/2 inch ribbon of ointment into the lower eyelid. Patient not taking: Reported on 09/28/2019 06/14/17   [name], [name], MD  HYDROcodone-acetaminophen (NORCO/VICODIN) 5-325 MG tablet Take 1 tablet by mouth every 6 (six) hours as needed. Patient not taking: Reported on 09/28/2019 06/14/17   [name], [name], MD  traZODone (DESYREL) 50 MG tablet TAKE ONE TABLET BY MOUTH AT BEDTIME. Patient not taking: Reported on 09/28/2019 08/08/13   [name], MD     Allergies:    No Known Allergies   Physical Exam:   Vitals  Blood pressure (!) 105/57, pulse 75, temperature 98 F (36.7 C), resp. rate 20, height 6\' 2"  (1.88 m), weight 95.3 kg, SpO2 96 %.  Physical Examination: General appearance - alert, well appearing, and in no distress  Mental status - alert, oriented to person, place, and time,  Eyes - sclera anicteric Neck - supple, no JVD elevation , Chest - clear  to auscultation bilaterally, symmetrical air movement,  Heart - S1 and S2 normal, regular  Abdomen -multiple scars including prior K[place] cholecystectomy scar, upper midline laparotomy scar, and lower abdominal and suprapubic area midline scar, abdomen is not particularly  distended, bowel sounds are somewhat diminished but present.  There is abdominal discomfort to palpation but no rebound or guarding---  Neurological - screening mental status exam normal, neck supple without rigidity, cranial nerves II through XII intact, DTR's normal and symmetric Extremities - no pedal edema noted, intact peripheral pulses  Skin - warm, dry   Data Review:    CBC Recent Labs  Lab 09/28/19 0733  WBC 11.2*  HGB 16.4  HCT 48.1  PLT 370  MCV 92.0  MCH 31.4  MCHC 34.1  RDW 13.3  LYMPHSABS 2.5  MONOABS 2.5*  EOSABS 0.2  BASOSABS 0.1   ------------------------------------------------------------------------------------------------------------------  Chemistries  Recent Labs  Lab 09/28/19 0733  NA 131*  K 3.9  CL 98  CO2 20*  GLUCOSE 120*  BUN 52*  CREATININE 1.89*  CALCIUM 8.4*  AST 15  ALT 19  ALKPHOS 45  BILITOT 0.9   ------------------------------------------------------------------------------------------------------------------ estimated creatinine clearance is 40.5 mL/min (A) (by C-G formula based on SCr of 1.89 mg/dL (H)). ------------------------------------------------------------------------------------------------------------------ No results for input(s): TSH, T4TOTAL, T3FREE, THYROIDAB in the last 72 hours.  Invalid input(s): FREET3   Coagulation profile No results for input(s): INR, PROTIME in the last 168 hours. ------------------------------------------------------------------------------------------------------------------- No results for input(s): DDIMER in the last 72 hours. -------------------------------------------------------------------------------------------------------------------  Cardiac Enzymes No results for input(s): CKMB, TROPONINI, MYOGLOBIN in the last 168 hours.  Invalid input(s): CK ------------------------------------------------------------------------------------------------------------------ No results  found for: BNP   ---------------------------------------------------------------------------------------------------------------  Urinalysis No results found for: COLORURINE, APPEARANCEUR, LABSPEC, PHURINE, GLUCOSEU, HGBUR, BILIRUBINUR, KETONESUR, PROTEINUR, UROBILINOGEN, NITRITE, LEUKOCYTESUR  ----------------------------------------------------------------------------------------------------------------   Imaging Results:    CT ABDOMEN PELVIS W CONTRAST  Result Date: 09/28/2019 CLINICAL DATA:  74 year old male with abdominal and pelvic pain with diarrhea for 6 days. Vomiting for 3 days. EXAM: CT ABDOMEN AND PELVIS WITH CONTRAST TECHNIQUE: Multidetector CT imaging of the abdomen and pelvis was performed using the standard protocol following bolus administration of intravenous contrast. CONTRAST:  61mL OMNIPAQUE IOHEXOL 300 MG/ML  SOLN COMPARISON:  None. FINDINGS: Lower chest: No acute abnormality. Hepatobiliary: The patient is status post cholecystectomy. Hepatic steatosis is identified without focal hepatic lesion. No biliary dilatation. Pancreas: Mildly atrophic without other abnormality. 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 Adrenals/Urinary Tract: The kidneys, adrenal glands and bladder are unremarkable except for a probable small LEFT renal cyst. Stomach/Bowel: Distended proximal mid small bowel loops are noted with nondistended but gas and fluid-filled distal loops of small bowel. Gas and fluid in the colon noted. There is a relative transition point within the anterior pelvis (series 2: Image 66) likely representing an partial small bowel obstruction, probably from an adhesion. Colonic diverticulosis noted without evidence of diverticulitis. No bowel wall thickening or pneumoperitoneum. What appears to be the appendix is normal. Vascular/Lymphatic: Aortic atherosclerosis. No enlarged abdominal or pelvic lymph nodes. Reproductive: Mildly prominent prostate noted.  Other: No ascites or focal collection/abscess. Musculoskeletal: No acute or suspicious bony abnormalities are noted. Mild degenerative changes in the lumbar spine are present. IMPRESSION: 1. Distended proximal and mid small bowel loops with relative transition point within the anterior pelvis, likely representing a partial small bowel obstruction, probably from an adhesion. No ascites, pneumoperitoneum or abscess. 2. Hepatic steatosis. 3. Aortic Atherosclerosis (ICD10-I70.0). Electronically Signed   By: Margarette Canada M.D.   On: 09/28/2019 10:15    Radiological Exams on Admission: CT ABDOMEN PELVIS W CONTRAST  Result Date: 09/28/2019 CLINICAL DATA:  74 year old male with abdominal and pelvic pain with diarrhea for 6 days. Vomiting for 3 days. EXAM: CT ABDOMEN AND PELVIS WITH CONTRAST TECHNIQUE: Multidetector CT imaging of the abdomen and pelvis was performed using the standard protocol following bolus administration of intravenous contrast. CONTRAST:  71mL OMNIPAQUE IOHEXOL 300 MG/ML  SOLN COMPARISON:  None. FINDINGS: Lower chest: No acute abnormality. Hepatobiliary: The patient is status post cholecystectomy. Hepatic steatosis is identified without focal hepatic lesion. No biliary dilatation. Pancreas: Mildly atrophic without other abnormality. 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 Adrenals/Urinary Tract: The kidneys, adrenal glands and bladder are unremarkable except for a probable small LEFT renal cyst. Stomach/Bowel: Distended proximal mid small bowel loops are noted with nondistended but gas and fluid-filled distal loops of small bowel. Gas and fluid in the colon noted. There is a relative transition point within the anterior pelvis (series 2: Image 66) likely representing an partial small bowel obstruction, probably from an adhesion. Colonic diverticulosis noted without evidence of diverticulitis. No bowel wall thickening or pneumoperitoneum. What appears to be the appendix is  normal. Vascular/Lymphatic: Aortic atherosclerosis. No enlarged abdominal or pelvic lymph nodes. Reproductive: Mildly prominent prostate noted. Other: No ascites or focal collection/abscess. Musculoskeletal: No acute or suspicious bony abnormalities are noted. Mild degenerative changes in the lumbar spine are present. IMPRESSION: 1. Distended proximal and mid small bowel loops with relative transition point within the anterior pelvis, likely representing a partial small bowel obstruction, probably from an adhesion. No ascites, pneumoperitoneum or abscess. 2. Hepatic steatosis. 3. Aortic Atherosclerosis (ICD10-I70.0). Electronically Signed   By: Margarette Canada M.D.   On: 09/28/2019 10:15    DVT Prophylaxis -SCD /heparin AM Labs Ordered, also please review Full Orders  Family Communication: Admission, patients condition and plan of care including tests being ordered have been discussed with the patient and who indicate understanding and agree with the plan   Code Status - Full Code  Likely DC to  home  Condition   stable  Roxan Hockey M.D on  09/28/2019 at 5:28 PM Go to [email or web address] -  for contact info  [facility] - Office  [phone]

## 2019-09-29 ENCOUNTER — Inpatient Hospital Stay (HOSPITAL_COMMUNITY): Payer: Medicare Other

## 2019-09-29 LAB — RENAL FUNCTION PANEL
Albumin: 2.9 g/dL — ABNORMAL LOW (ref 3.5–5.0)
Anion gap: 11 (ref 5–15)
BUN: 21 mg/dL (ref 8–23)
CO2: 18 mmol/L — ABNORMAL LOW (ref 22–32)
Calcium: 7.6 mg/dL — ABNORMAL LOW (ref 8.9–10.3)
Chloride: 107 mmol/L (ref 98–111)
Creatinine, Ser: 1 mg/dL (ref 0.61–1.24)
GFR calc Af Amer: >60 mL/min (ref >60)
GFR calc non Af Amer: >60 mL/min (ref >60)
Glucose, Bld: 90 mg/dL (ref 70–99)
Phosphorus: 2.1 mg/dL — ABNORMAL LOW (ref 2.5–4.6)
Potassium: 3.9 mmol/L (ref 3.5–5.1)
Sodium: 136 mmol/L (ref 135–145)

## 2019-09-29 LAB — CBC
HCT: 42.4 % (ref 39.0–52.0)
Hemoglobin: 14.1 g/dL (ref 13.0–17.0)
MCH: 31.1 pg (ref 26.0–34.0)
MCHC: 33.3 g/dL (ref 30.0–36.0)
MCV: 93.6 fL (ref 80.0–100.0)
Platelets: 379 10*3/uL (ref 150–400)
RBC: 4.53 MIL/uL (ref 4.22–5.81)
RDW: 13.9 % (ref 11.5–15.5)
WBC: 13 10*3/uL — ABNORMAL HIGH (ref 4.0–10.5)
nRBC: 0 % (ref 0.0–0.2)

## 2019-09-29 LAB — GLUCOSE, CAPILLARY
Glucose-Capillary: 80 mg/dL (ref 70–99)
Glucose-Capillary: 108 mg/dL — ABNORMAL HIGH (ref 70–99)
Glucose-Capillary: 83 mg/dL (ref 70–99)
Glucose-Capillary: 107 mg/dL — ABNORMAL HIGH (ref 70–99)

## 2019-09-29 MED ORDER — CALCIUM CARBONATE ANTACID 500 MG PO CHEW
400.0000 mg | CHEWABLE_TABLET | Freq: Once | ORAL | Status: AC
  Administered 2019-09-29: 13:00:00 400 mg via ORAL
  Filled 2019-09-29: qty 2

## 2019-09-29 MED ORDER — BISACODYL 10 MG RE SUPP
10.0000 mg | Freq: Once | RECTAL | Status: AC
  Administered 2019-09-29: 10 mg via RECTAL
  Filled 2019-09-29: qty 1

## 2019-09-29 NOTE — Progress Notes (Signed)
Patient Demographics:    Andre [name], is a 74 y.o. male, DOB - 1945-12-13, [id number]  Admit date - 09/28/2019   Admitting Physician Anvith Mauriello Denton Brick, MD  Outpatient Primary MD for the patient is Celene Squibb, MD  LOS - 1   Chief Complaint  Patient presents with  . Emesis        Subjective:    Andre [name] today has no fevers, no emesis,  No chest pain,   - ---  Assessment  & Plan :    Principal Problem:   SBO (small bowel obstruction) (HCC) Active Problems:   History of TIAs   COPD (chronic obstructive pulmonary disease) (HCC)   AKI (acute kidney injury) (HCC)   Dehydration   Emesis, persistent  Brief Summary:- 74 y.o. 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 Arnoldo Morale) , no need for NG tube at this time, continue liquid diet -IV fluids until oral intake more reliable -CT abdomen and pelvis on 09/28/2019 with possible pSBO secondary to adhesions with transition point in the pelvis -Abdominal x-rays on 09/29/2019 continues to show pSBO --WBC is up to 13 from 11.2, - lactic acid is not elevated -Give Dulcolax suppository  2)AKI----acute kidney injury--- worsening renal function due to dehydration secondary to GI losses with vomiting or diarrhea PTA  --creatinine on admission= 1.89 , baseline creatinine =  0.9 to 1.0   , -Creatinine is  down to 1.0 with hydration --, renally adjust medications, avoid nephrotoxic agents / dehydration  / hypotension -Continue to hold Metformin, hold losartan  3)Intractable emesis--- improving, see #1 above, as needed Zofran -Continue IV fluids until oral intake is reliable  4)DM2-as per patient previously well controlled,  --hold Metformin and Januvia, Allow some permissive Hyperglycemia rather than risk life-threatening hypoglycemia in a patient with unreliable oral intake.  -Check Accu-Cheks/Fingersticks as ordered  5) history of COPD/tobacco abuse--stable, no acute exacerbation at this time --Not interested in smoking cessation  6) prior history of TIA--continue Lipitor and aspirin  Disposition/Need for in-Hospital Stay- patient unable to be discharged at this time due to pSBO and AKI secondary to GI losses and poor oral intake ----requiring IV fluids pending improvement in bowel function and oral intake -Patient From: home D/C Place: home Barriers: Not Clinically Stable-   Code Status : full  Family Communication:   NA (patient is alert, awake and coherent)  Consults  : Curbside consultation with general surgeon Dr. Arnoldo Morale  DVT Prophylaxis  :    - Heparin - SCDs   Lab Results  Component Value Date   PLT 379 09/29/2019    Inpatient Medications  Scheduled Meds: . aspirin  325 mg Oral Q breakfast  . atorvastatin  20  mg Oral QPM  . bisacodyl  10 mg Rectal Once  . heparin  5,000 Units Subcutaneous Q8H  . nicotine  14 mg Transdermal Daily  . sodium chloride flush  3 mL Intravenous Q12H  . traZODone  50 mg Oral QHS   Continuous Infusions: . sodium chloride    . sodium chloride 50 mL/hr at 09/29/19 1000   PRN Meds:.sodium chloride, morphine injection, ondansetron **OR** ondansetron (ZOFRAN) IV, polyethylene glycol, sodium chloride flush    Anti-infectives (From admission, onward)   None        Objective:   Vitals:   09/29/19 0032  09/29/19 0200 09/29/19 0559 09/29/19 1305  BP:  115/63 113/60 118/68  Pulse:  77 83 72  Resp:  16 16 18   Temp: 98 F (36.7 C)  (!) 97.5 F (36.4 C) 98.3 F (36.8 C)  TempSrc: Oral  Oral Oral  SpO2:  94% 91% 94%  Weight:      Height:        Wt Readings from Last 3 Encounters:  09/28/19 95.3 kg  06/14/17 95.7 kg  12/29/15 87.1 kg     Intake/Output Summary (Last 24 hours) at 09/29/2019 1459 Last data filed at 09/29/2019 1300 Gross per 24 hour  Intake 3056.04 ml  Output --  Net 3056.04 ml    Physical Exam Physical Examination: General appearance - alert, well appearing, and in no distress  Mental status - alert, oriented to person, place, and time,  Eyes - sclera anicteric Neck - supple, no JVD elevation , Chest - clear  to auscultation bilaterally, symmetrical air movement,  Heart - S1 and S2 normal, regular  Abdomen -multiple scars including prior Kocher cholecystectomy scar, upper midline laparotomy scar, and lower abdominal and suprapubic area midline scar, abdomen is not particularly distended, bowel sounds are somewhat diminished but present.  There is abdominal discomfort to palpation but no rebound or guarding---  Neurological - screening mental status exam normal, neck supple without rigidity, cranial nerves II through XII intact, DTR's normal and symmetric Extremities - no pedal edema noted, intact peripheral pulses  Skin - warm, dry   Data Review:   Micro Results Recent Results (from the past 240 hour(s))  Blood culture (routine x 2)     Status: None (Preliminary result)   Collection Time: 09/28/19 11:43 AM   Specimen: Left Antecubital; Blood  Result Value Ref Range Status   Specimen Description LEFT ANTECUBITAL BOTTLES DRAWN AEROBIC ONLY  Final   Special Requests Blood Culture adequate volume  Final   Culture   Final    NO GROWTH < 24 HOURS Performed at [facility], [address], [place], [place] [address]    Report Status PENDING  Incomplete  Blood  culture (routine x 2)     Status: None (Preliminary result)   Collection Time: 09/28/19 11:44 AM   Specimen: Right Antecubital; Blood  Result Value Ref Range Status   Specimen Description   Final    RIGHT ANTECUBITAL BOTTLES DRAWN AEROBIC AND ANAEROBIC   Special Requests Blood Culture adequate volume  Final   Culture   Final    NO GROWTH < 24 HOURS Performed at [facility], [address], [place], [place] [address]    Report Status PENDING  Incomplete  Respiratory Panel by RT PCR (Flu A&B, Covid) - Nasopharyngeal Swab     Status: None   Collection Time: 09/28/19  1:14 PM   Specimen: Nasopharyngeal Swab  Result Value Ref Range Status   SARS  Coronavirus 2 by RT PCR NEGATIVE NEGATIVE Final    Comment: (NOTE) SARS-CoV-2 target nucleic acids are NOT DETECTED. The SARS-CoV-2 RNA is generally detectable in upper respiratoy specimens during the acute phase of infection. The lowest concentration of SARS-CoV-2 viral copies this assay can detect is 131 copies/mL. A negative result does not preclude SARS-Cov-2 infection and should not be used as the sole basis for treatment or other patient management decisions. A negative result may occur with  improper specimen collection/handling, submission of specimen other than nasopharyngeal swab, presence of viral mutation(s) within the areas targeted by this assay, and inadequate number of viral copies (<131 copies/mL). A negative result must be combined with clinical observations, patient history, and epidemiological information. The expected result is Negative. Fact Sheet for Patients:  PinkCheek.be Fact Sheet for Healthcare Providers:  GravelBags.it This test is not yet ap proved or cleared by the [place] FDA and  has been authorized for detection and/or diagnosis of SARS-CoV-2 by FDA under an Emergency Use Authorization (EUA). This EUA will remain  in effect (meaning this test can  be used) for the duration of the COVID-19 declaration under Section 564(b)(1) of the Act, 21 U.S.C. section 360bbb-3(b)(1), unless the authorization is terminated or revoked sooner.    Influenza A by PCR NEGATIVE NEGATIVE Final   Influenza B by PCR NEGATIVE NEGATIVE Final    Comment: (NOTE) The Xpert Xpress SARS-CoV-2/FLU/RSV assay is intended as an aid in  the diagnosis of influenza from Nasopharyngeal swab specimens and  should not be used as a sole basis for treatment. Nasal washings and  aspirates are unacceptable for Xpert Xpress SARS-CoV-2/FLU/RSV  testing. Fact Sheet for Patients: PinkCheek.be Fact Sheet for Healthcare Providers: GravelBags.it This test is not yet approved or cleared by the [place] FDA and  has been authorized for detection and/or diagnosis of SARS-CoV-2 by  FDA under an Emergency Use Authorization (EUA). This EUA will remain  in effect (meaning this test can be used) for the duration of the  Covid-19 declaration under Section 564(b)(1) of the Act, 21  U.S.C. section 360bbb-3(b)(1), unless the authorization is  terminated or revoked. Performed at [facility], [address] [place], [place] [address]     Radiology Reports CT ABDOMEN PELVIS W CONTRAST  Result Date: 09/28/2019 CLINICAL DATA:  74 year old male with abdominal and pelvic pain with diarrhea for 6 days. Vomiting for 3 days. EXAM: CT ABDOMEN AND PELVIS WITH CONTRAST TECHNIQUE: Multidetector CT imaging of the abdomen and pelvis was performed using the standard protocol following bolus administration of intravenous contrast. CONTRAST:  73mL OMNIPAQUE IOHEXOL 300 MG/ML  SOLN COMPARISON:  None. FINDINGS: Lower chest: No acute abnormality. Hepatobiliary: The patient is status post cholecystectomy. Hepatic steatosis is identified without focal hepatic lesion. No biliary dilatation. Pancreas: Mildly atrophic without other abnormality. 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 Adrenals/Urinary Tract: The kidneys, adrenal glands and bladder are unremarkable except for a probable small LEFT renal cyst. Stomach/Bowel: Distended proximal mid small bowel loops are noted with nondistended but gas and fluid-filled distal loops of small bowel. Gas and fluid in the colon noted. There is a relative transition point within the anterior pelvis (series 2: Image 66) likely representing an partial small bowel obstruction, probably from an adhesion. Colonic diverticulosis noted without evidence of diverticulitis. No bowel wall thickening or pneumoperitoneum. What appears to be the appendix is normal. Vascular/Lymphatic: Aortic atherosclerosis. No enlarged abdominal or pelvic lymph nodes. Reproductive: Mildly prominent prostate noted. Other: No  ascites or focal collection/abscess. Musculoskeletal: No acute or suspicious bony abnormalities are noted. Mild degenerative changes in the lumbar spine are present. IMPRESSION: 1. Distended proximal and mid small bowel loops with relative transition point within the anterior pelvis, likely representing a partial small bowel obstruction, probably from an adhesion. No ascites, pneumoperitoneum or abscess. 2. Hepatic steatosis. 3. Aortic Atherosclerosis (ICD10-I70.0). Electronically Signed   By: Margarette Canada M.D.   On: 09/28/2019 10:15   DG Abd 2 Views  Result Date: 09/29/2019 CLINICAL DATA:  Abdominal pain. EXAM: ABDOMEN - 2 VIEW COMPARISON:  None. FINDINGS: Prominent loops of small bowel, out of proportion to the colonic bowel gas pattern. There is air throughout the length of the colon. No free air, portal venous gas, or pneumatosis. Mild bibasilar pulmonary opacities. IMPRESSION: 1. Findings are concerning for early or partial small bowel obstruction. Recommend clinical correlation and attention on follow-up. 2. Mild bibasilar pulmonary opacities. Electronically Signed   By: Dorise Bullion III M.D    On: 09/29/2019 14:11     CBC Recent Labs  Lab 09/28/19 0733 09/29/19 0732  WBC 11.2* 13.0*  HGB 16.4 14.1  HCT 48.1 42.4  PLT 370 379  MCV 92.0 93.6  MCH 31.4 31.1  MCHC 34.1 33.3  RDW 13.3 13.9  LYMPHSABS 2.5  --   MONOABS 2.5*  --   EOSABS 0.2  --   BASOSABS 0.1  --     Chemistries  Recent Labs  Lab 09/28/19 0733 09/29/19 0732  NA 131* 136  K 3.9 3.9  CL 98 107  CO2 20* 18*  GLUCOSE 120* 90  BUN 52* 21  CREATININE 1.89* 1.00  CALCIUM 8.4* 7.6*  AST 15  --   ALT 19  --   ALKPHOS 45  --   BILITOT 0.9  --    ------------------------------------------------------------------------------------------------------------------ No results for input(s): CHOL, HDL, LDLCALC, TRIG, CHOLHDL, LDLDIRECT in the last 72 hours.  No results found for: HGBA1C ------------------------------------------------------------------------------------------------------------------ No results for input(s): TSH, T4TOTAL, T3FREE, THYROIDAB in the last 72 hours.  Invalid input(s): FREET3 ------------------------------------------------------------------------------------------------------------------ No results for input(s): VITAMINB12, FOLATE, FERRITIN, TIBC, IRON, RETICCTPCT in the last 72 hours.  Coagulation profile No results for input(s): INR, PROTIME in the last 168 hours.  No results for input(s): DDIMER in the last 72 hours.  Cardiac Enzymes No results for input(s): CKMB, TROPONINI, MYOGLOBIN in the last 168 hours.  Invalid input(s): CK ------------------------------------------------------------------------------------------------------------------ No results found for: BNP   Roxan Hockey M.D on 09/29/2019 at 2:59 PM  Go to [email or web address] - for contact info  [facility] - Office  [phone]

## 2019-09-30 ENCOUNTER — Inpatient Hospital Stay (HOSPITAL_COMMUNITY): Payer: Medicare Other

## 2019-09-30 LAB — BASIC METABOLIC PANEL
Anion gap: 12 (ref 5–15)
BUN: 12 mg/dL (ref 8–23)
CO2: 19 mmol/L — ABNORMAL LOW (ref 22–32)
Calcium: 8.2 mg/dL — ABNORMAL LOW (ref 8.9–10.3)
Chloride: 105 mmol/L (ref 98–111)
Creatinine, Ser: 1.19 mg/dL (ref 0.61–1.24)
GFR calc Af Amer: >60 mL/min (ref >60)
GFR calc non Af Amer: >60 mL/min (ref >60)
Glucose, Bld: 109 mg/dL — ABNORMAL HIGH (ref 70–99)
Potassium: 3.5 mmol/L (ref 3.5–5.1)
Sodium: 136 mmol/L (ref 135–145)

## 2019-09-30 LAB — GLUCOSE, CAPILLARY
Glucose-Capillary: 103 mg/dL — ABNORMAL HIGH (ref 70–99)
Glucose-Capillary: 101 mg/dL — ABNORMAL HIGH (ref 70–99)
Glucose-Capillary: 93 mg/dL (ref 70–99)
Glucose-Capillary: 120 mg/dL — ABNORMAL HIGH (ref 70–99)

## 2019-09-30 LAB — CBC
HCT: 43.9 % (ref 39.0–52.0)
Hemoglobin: 14.7 g/dL (ref 13.0–17.0)
MCH: 31.1 pg (ref 26.0–34.0)
MCHC: 33.5 g/dL (ref 30.0–36.0)
MCV: 93 fL (ref 80.0–100.0)
Platelets: 413 10*3/uL — ABNORMAL HIGH (ref 150–400)
RBC: 4.72 MIL/uL (ref 4.22–5.81)
RDW: 13.8 % (ref 11.5–15.5)
WBC: 17.1 10*3/uL — ABNORMAL HIGH (ref 4.0–10.5)
nRBC: 0 % (ref 0.0–0.2)

## 2019-09-30 MED ORDER — PANTOPRAZOLE SODIUM 40 MG PO TBEC
40.0000 mg | DELAYED_RELEASE_TABLET | Freq: Every day | ORAL | Status: DC
  Administered 2019-09-30 – 2019-10-02 (×3): 40 mg via ORAL
  Filled 2019-09-30 (×3): qty 1

## 2019-09-30 MED ORDER — DIATRIZOATE MEGLUMINE & SODIUM 66-10 % PO SOLN
ORAL | Status: AC
  Filled 2019-09-30: qty 90

## 2019-09-30 MED ORDER — CALCIUM CARBONATE ANTACID 500 MG PO CHEW
400.0000 mg | CHEWABLE_TABLET | Freq: Two times a day (BID) | ORAL | Status: DC
  Administered 2019-09-30 – 2019-10-02 (×5): 400 mg via ORAL
  Filled 2019-09-30 (×5): qty 2

## 2019-09-30 MED ORDER — DIATRIZOATE MEGLUMINE & SODIUM 66-10 % PO SOLN
90.0000 mL | Freq: Once | ORAL | Status: AC
  Administered 2019-09-30: 90 mL via NASOGASTRIC
  Filled 2019-09-30: qty 90

## 2019-09-30 MED ORDER — DIATRIZOATE MEGLUMINE & SODIUM 66-10 % PO SOLN
90.0000 mL | Freq: Once | ORAL | Status: AC
  Filled 2019-09-30: qty 90

## 2019-09-30 NOTE — Care Management Important Message (Signed)
Important Message  Patient Details  Name: Andre [name] MRN: [id number] Date of Birth: 10/20/1945   Medicare Important Message Given:  Yes     Tommy Medal 09/30/2019, 3:30 PM

## 2019-09-30 NOTE — Progress Notes (Signed)
Patient Demographics:    Andre [name], is a 74 y.o. male, DOB - 1945-09-12, [id number]  Admit date - 09/28/2019   Admitting Physician Brynn Reznik Denton Brick, MD  Outpatient Primary MD for the patient is Celene Squibb, MD  LOS - 2   Chief Complaint  Patient presents with  . Emesis        Subjective:    Andre [name] today has no fevers, no emesis,  No chest pain,   -Nausea and loose stools persist, male friend at bedside,  -----Patient drinking contrast for- small bowel follow-through protocol to rule in or rule out obstruction  Assessment  & Plan :    Principal Problem:   SBO (small bowel obstruction) (HCC) Active Problems:   History of TIAs   COPD (chronic obstructive pulmonary disease) (HCC)   AKI (acute kidney injury) ([place])   Dehydration   Emesis, persistent  Brief Summary:- 74 y.o. 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 Arnoldo Morale) , no need for NG tube at this time, continue liquid diet -IV fluids until oral intake more reliable -CT abdomen and pelvis on 09/28/2019 with possible pSBO secondary to adhesions with transition point in the pelvis -Abdominal x-rays on 09/29/2019 and 09/30/2019 continues to show pSBO --WBC is up to 17 from 11.2, - lactic acid is not elevated -Previously received Dulcolax suppository -Discussed  with Dr. Alphia Kava general surgeon who recommends small bowel follow-through protocol to rule in or rule out obstruction  2)AKI----acute kidney injury--- worsening renal function due to dehydration secondary to GI losses with vomiting or diarrhea PTA  --creatinine on admission= 1.89 , baseline creatinine =  0.9 to 1.0  , -Creatinine is  down to 1.0 with hydration --, renally adjust medications, avoid nephrotoxic agents / dehydration  / hypotension -Continue to hold Metformin, hold losartan  3)Intractable emesis--- improving, see #1 above, as needed Zofran -Continue IV fluids until oral intake is reliable  4)DM2-as per patient previously well controlled,  --hold Metformin and Januvia, Allow some permissive Hyperglycemia rather than risk life-threatening hypoglycemia in a patient with unreliable oral intake.  -Check Accu-Cheks/Fingersticks as ordered  5) history of COPD/tobacco abuse--stable, no acute exacerbation at this time --Not interested in smoking cessation  6) prior history of TIA--continue Lipitor and aspirin  Disposition/Need for in-Hospital Stay- patient unable to be discharged at this time due to pSBO and AKI secondary to GI losses and poor oral intake ----requiring IV fluids pending improvement in bowel function and oral intake -Small bowel radiology follow-through protocol pending -Patient From: home D/C Place: home Barriers: Not Clinically Stable-   Code Status : full  Family Communication:   (patient is alert, awake and coherent) -Male friend at bedside  Consults  :  Curbside consultation with general surgeon Dr. Arnoldo Morale  DVT Prophylaxis  :    - Heparin - SCDs   Lab Results  Component Value Date   PLT 413 (H) 09/30/2019    Inpatient Medications  Scheduled Meds: . aspirin  325 mg Oral Q breakfast  . atorvastatin  20 mg Oral QPM  . calcium carbonate  400 mg of elemental calcium Oral BID  . heparin  5,000 Units Subcutaneous Q8H  . pantoprazole  40 mg  Oral Daily  . sodium chloride flush  3 mL Intravenous Q12H  . traZODone  50 mg Oral QHS   Continuous Infusions: . sodium chloride    . sodium chloride 50 mL/hr at 09/29/19 2047   PRN Meds:.sodium chloride, morphine injection, ondansetron **OR** ondansetron (ZOFRAN) IV, polyethylene glycol, sodium chloride flush    Anti-infectives (From admission, onward)   None        Objective:   Vitals:   09/29/19 2136 09/30/19 0621 09/30/19 0923 09/30/19 1408  BP: (!) 110/51 112/64  117/82  Pulse: 77 72  70  Resp: 19 18  18   Temp: 100.1 F (37.8 C) 98.1 F (36.7 C)  (!) 97.5 F (36.4 C)  TempSrc: Oral Oral  Oral  SpO2: 95% 96% 93% 97%  Weight:      Height:        Wt Readings from Last 3 Encounters:  09/28/19 95.3 kg  06/14/17 95.7 kg  12/29/15 87.1 kg     Intake/Output Summary (Last 24 hours) at 09/30/2019 1714 Last data filed at 09/30/2019 1023 Gross per 24 hour  Intake 3 ml  Output --  Net 3 ml    Physical Exam Physical Examination: General appearance - alert, well appearing, and in no distress  Mental status - alert, oriented to person, place, and time,  Eyes - sclera anicteric Neck - supple, no JVD elevation , Chest - clear  to auscultation bilaterally, symmetrical air movement,  Heart - S1 and S2 normal, regular  Abdomen -multiple scars including prior Kocher cholecystectomy scar, upper midline laparotomy scar, and lower abdominal and suprapubic area midline scar, abdomen is not particularly distended, bowel sounds are somewhat diminished but present.  There is abdominal discomfort to palpation but no rebound or guarding---  Neurological - screening mental status exam normal, neck supple without rigidity, cranial nerves II through XII intact, DTR's normal and symmetric Extremities - no pedal edema noted, intact peripheral pulses  Skin - warm, dry   Data Review:   Micro Results Recent Results (from the past 240 hour(s))  Blood culture (routine x 2)     Status:  None (Preliminary result)   Collection Time: 09/28/19 11:43 AM   Specimen: Left Antecubital; Blood  Result Value Ref Range Status   Specimen Description LEFT ANTECUBITAL BOTTLES DRAWN AEROBIC ONLY  Final   Special Requests Blood Culture adequate volume  Final   Culture   Final    NO GROWTH 2 DAYS Performed at [facility], [address], [place], [place] [address]    Report Status PENDING  Incomplete  Blood culture (routine x 2)     Status: None (Preliminary result)   Collection Time: 09/28/19 11:44 AM   Specimen: Right Antecubital; Blood  Result Value Ref Range Status   Specimen Description   Final    RIGHT ANTECUBITAL BOTTLES DRAWN AEROBIC AND ANAEROBIC   Special Requests Blood Culture adequate volume  Final   Culture   Final    NO GROWTH 2 DAYS Performed at  [facility], [address], [place], [place] [address]    Report Status PENDING  Incomplete  Respiratory Panel by RT PCR (Flu A&B, Covid) - Nasopharyngeal Swab     Status: None   Collection Time: 09/28/19  1:14 PM   Specimen: Nasopharyngeal Swab  Result Value Ref Range Status   SARS Coronavirus 2 by RT PCR NEGATIVE NEGATIVE Final    Comment: (NOTE) SARS-CoV-2 target nucleic acids are NOT DETECTED. The SARS-CoV-2 RNA is generally detectable in upper respiratoy specimens during the acute phase of infection. The lowest concentration of SARS-CoV-2 viral copies this assay can detect is 131 copies/mL. A negative result does not preclude SARS-Cov-2 infection and should not be used as the sole basis for treatment or other patient management decisions. A negative result may occur with  improper specimen collection/handling, submission of specimen other than nasopharyngeal swab, presence of viral mutation(s) within the areas targeted by this assay, and inadequate number of viral copies (<131 copies/mL). A negative result must be combined with clinical observations, patient history, and epidemiological information. The expected  result is Negative. Fact Sheet for Patients:  PinkCheek.be Fact Sheet for Healthcare Providers:  GravelBags.it This test is not yet ap proved or cleared by the [place] FDA and  has been authorized for detection and/or diagnosis of SARS-CoV-2 by FDA under an Emergency Use Authorization (EUA). This EUA will remain  in effect (meaning this test can be used) for the duration of the COVID-19 declaration under Section 564(b)(1) of the Act, 21 U.S.C. section 360bbb-3(b)(1), unless the authorization is terminated or revoked sooner.    Influenza A by PCR NEGATIVE NEGATIVE Final   Influenza B by PCR NEGATIVE NEGATIVE Final    Comment: (NOTE) The Xpert Xpress SARS-CoV-2/FLU/RSV assay is intended as an aid in  the diagnosis of influenza from Nasopharyngeal swab specimens and  should not be used as a sole basis for treatment. Nasal washings and  aspirates are unacceptable for Xpert Xpress SARS-CoV-2/FLU/RSV  testing. Fact Sheet for Patients: PinkCheek.be Fact Sheet for Healthcare Providers: GravelBags.it This test is not yet approved or cleared by the [place] FDA and  has been authorized for detection and/or diagnosis of SARS-CoV-2 by  FDA under an Emergency Use Authorization (EUA). This EUA will remain  in effect (meaning this test can be used) for the duration of the  Covid-19 declaration under Section 564(b)(1) of the Act, 21  U.S.C. section 360bbb-3(b)(1), unless the authorization is  terminated or revoked. Performed at [facility], [address], [place], [place] [address]     Radiology Reports CT ABDOMEN PELVIS W CONTRAST  Result Date: 09/28/2019 CLINICAL DATA:  74 year old male with abdominal and pelvic pain with diarrhea for 6 days. Vomiting for 3 days. EXAM: CT ABDOMEN AND PELVIS WITH CONTRAST TECHNIQUE: Multidetector CT imaging of the abdomen and pelvis  was performed using the standard protocol following bolus administration of intravenous contrast. CONTRAST:  30mL OMNIPAQUE IOHEXOL 300 MG/ML  SOLN COMPARISON:  None. FINDINGS: Lower chest: No acute abnormality. Hepatobiliary: The patient is status post cholecystectomy. Hepatic steatosis is identified without focal hepatic lesion. No biliary dilatation. Pancreas: Mildly atrophic without other abnormality. 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 Adrenals/Urinary Tract: The kidneys, adrenal glands and bladder are unremarkable except for a probable small LEFT renal cyst. Stomach/Bowel: Distended proximal mid small bowel loops are noted with nondistended but gas and fluid-filled distal loops of small bowel. Gas and fluid in the colon noted. There is a relative transition point  within the anterior pelvis (series 2: Image 66) likely representing an partial small bowel obstruction, probably from an adhesion. Colonic diverticulosis noted without evidence of diverticulitis. No bowel wall thickening or pneumoperitoneum. What appears to be the appendix is normal. Vascular/Lymphatic: Aortic atherosclerosis. No enlarged abdominal or pelvic lymph nodes. Reproductive: Mildly prominent prostate noted. Other: No ascites or focal collection/abscess. Musculoskeletal: No acute or suspicious bony abnormalities are noted. Mild degenerative changes in the lumbar spine are present. IMPRESSION: 1. Distended proximal and mid small bowel loops with relative transition point within the anterior pelvis, likely representing a partial small bowel obstruction, probably from an adhesion. No ascites, pneumoperitoneum or abscess. 2. Hepatic steatosis. 3. Aortic Atherosclerosis (ICD10-I70.0). Electronically Signed   By: Margarette Canada M.D.   On: 09/28/2019 10:15   DG Abd 2 Views  Result Date: 09/30/2019 CLINICAL DATA:  Nausea, vomiting, diarrhea since 09/23/2019, diabetes EXAM: ABDOMEN - 2 VIEW COMPARISON:  09/29/2019  FINDINGS: Supine and upright frontal views of the abdomen and pelvis are obtained. Nonspecific gaseous distension of loops of small bowel in the central abdomen are again seen, measuring up to 3.8 cm in diameter. There is gas throughout the colon to the level of the rectum. Minimal scattered gas fluid levels. No free gas in the greater peritoneal sac. Lung bases are clear. IMPRESSION: 1. Stable small bowel dilatation within the central abdomen, which may reflect early/intermittent obstruction. Bowel gas again seen throughout the colon to the level of the rectum. Electronically Signed   By: Randa Ngo M.D.   On: 09/30/2019 09:56   DG Abd 2 Views  Result Date: 09/29/2019 CLINICAL DATA:  Abdominal pain. EXAM: ABDOMEN - 2 VIEW COMPARISON:  None. FINDINGS: Prominent loops of small bowel, out of proportion to the colonic bowel gas pattern. There is air throughout the length of the colon. No free air, portal venous gas, or pneumatosis. Mild bibasilar pulmonary opacities. IMPRESSION: 1. Findings are concerning for early or partial small bowel obstruction. Recommend clinical correlation and attention on follow-up. 2. Mild bibasilar pulmonary opacities. Electronically Signed   By: Dorise Bullion III M.D   On: 09/29/2019 14:11     CBC Recent Labs  Lab 09/28/19 0733 09/29/19 0732 09/30/19 0451  WBC 11.2* 13.0* 17.1*  HGB 16.4 14.1 14.7  HCT 48.1 42.4 43.9  PLT 370 379 413*  MCV 92.0 93.6 93.0  MCH 31.4 31.1 31.1  MCHC 34.1 33.3 33.5  RDW 13.3 13.9 13.8  LYMPHSABS 2.5  --   --   MONOABS 2.5*  --   --   EOSABS 0.2  --   --   BASOSABS 0.1  --   --     Chemistries  Recent Labs  Lab 09/28/19 0733 09/29/19 0732 09/30/19 0451  NA 131* 136 136  K 3.9 3.9 3.5  CL 98 107 105  CO2 20* 18* 19*  GLUCOSE 120* 90 109*  BUN 52* 21 12  CREATININE 1.89* 1.00 1.19  CALCIUM 8.4* 7.6* 8.2*  AST 15  --   --   ALT 19  --   --   ALKPHOS 45  --   --   BILITOT 0.9  --   --     ------------------------------------------------------------------------------------------------------------------ No results for input(s): CHOL, HDL, LDLCALC, TRIG, CHOLHDL, LDLDIRECT in the last 72 hours.  No results found for: HGBA1C ------------------------------------------------------------------------------------------------------------------ No results for input(s): TSH, T4TOTAL, T3FREE, THYROIDAB in the last 72 hours.  Invalid input(s): FREET3 ------------------------------------------------------------------------------------------------------------------ No results for input(s): VITAMINB12, FOLATE, FERRITIN, TIBC, IRON,  RETICCTPCT in the last 72 hours.  Coagulation profile No results for input(s): INR, PROTIME in the last 168 hours.  No results for input(s): DDIMER in the last 72 hours.  Cardiac Enzymes No results for input(s): CKMB, TROPONINI, MYOGLOBIN in the last 168 hours.  Invalid input(s): CK ------------------------------------------------------------------------------------------------------------------ No results found for: BNP   Roxan Hockey M.D on 09/30/2019 at 5:14 PM  Go to [email or web address] - for contact info  [facility] - Office  [phone]

## 2019-10-01 ENCOUNTER — Inpatient Hospital Stay (HOSPITAL_COMMUNITY): Payer: Medicare Other

## 2019-10-01 DIAGNOSIS — K56609 Unspecified intestinal obstruction, unspecified as to partial versus complete obstruction: Secondary | ICD-10-CM

## 2019-10-01 DIAGNOSIS — D72829 Elevated white blood cell count, unspecified: Secondary | ICD-10-CM | POA: Diagnosis present

## 2019-10-01 DIAGNOSIS — R197 Diarrhea, unspecified: Secondary | ICD-10-CM | POA: Diagnosis present

## 2019-10-01 LAB — CBC
HCT: 42 % (ref 39.0–52.0)
Hemoglobin: 14.2 g/dL (ref 13.0–17.0)
MCH: 31.1 pg (ref 26.0–34.0)
MCHC: 33.8 g/dL (ref 30.0–36.0)
MCV: 91.9 fL (ref 80.0–100.0)
Platelets: 422 10*3/uL — ABNORMAL HIGH (ref 150–400)
RBC: 4.57 MIL/uL (ref 4.22–5.81)
RDW: 14 % (ref 11.5–15.5)
WBC: 19.6 10*3/uL — ABNORMAL HIGH (ref 4.0–10.5)
nRBC: 0 % (ref 0.0–0.2)

## 2019-10-01 LAB — COMPREHENSIVE METABOLIC PANEL
ALT: 20 U/L (ref 0–44)
AST: 19 U/L (ref 15–41)
Albumin: 3 g/dL — ABNORMAL LOW (ref 3.5–5.0)
Alkaline Phosphatase: 45 U/L (ref 38–126)
Anion gap: 12 (ref 5–15)
BUN: 9 mg/dL (ref 8–23)
CO2: 19 mmol/L — ABNORMAL LOW (ref 22–32)
Calcium: 8.6 mg/dL — ABNORMAL LOW (ref 8.9–10.3)
Chloride: 107 mmol/L (ref 98–111)
Creatinine, Ser: 0.95 mg/dL (ref 0.61–1.24)
GFR calc Af Amer: >60 mL/min (ref >60)
GFR calc non Af Amer: >60 mL/min (ref >60)
Glucose, Bld: 103 mg/dL — ABNORMAL HIGH (ref 70–99)
Potassium: 3.4 mmol/L — ABNORMAL LOW (ref 3.5–5.1)
Sodium: 138 mmol/L (ref 135–145)
Total Bilirubin: 0.5 mg/dL (ref 0.3–1.2)
Total Protein: 6.9 g/dL (ref 6.5–8.1)

## 2019-10-01 LAB — CBC WITH DIFFERENTIAL/PLATELET
Abs Immature Granulocytes: 0.54 10*3/uL — ABNORMAL HIGH (ref 0.00–0.07)
Basophils Absolute: 0.1 10*3/uL (ref 0.0–0.1)
Basophils Relative: 1 %
Eosinophils Absolute: 0.2 10*3/uL (ref 0.0–0.5)
Eosinophils Relative: 1 %
HCT: 40.2 % (ref 39.0–52.0)
Hemoglobin: 13.7 g/dL (ref 13.0–17.0)
Immature Granulocytes: 3 %
Lymphocytes Relative: 20 %
Lymphs Abs: 3.6 10*3/uL (ref 0.7–4.0)
MCH: 31.7 pg (ref 26.0–34.0)
MCHC: 34.1 g/dL (ref 30.0–36.0)
MCV: 93.1 fL (ref 80.0–100.0)
Monocytes Absolute: 1.9 10*3/uL — ABNORMAL HIGH (ref 0.1–1.0)
Monocytes Relative: 10 %
Neutro Abs: 11.8 10*3/uL — ABNORMAL HIGH (ref 1.7–7.7)
Neutrophils Relative %: 65 %
Platelets: 387 10*3/uL (ref 150–400)
RBC: 4.32 MIL/uL (ref 4.22–5.81)
RDW: 14 % (ref 11.5–15.5)
WBC: 18.1 10*3/uL — ABNORMAL HIGH (ref 4.0–10.5)
nRBC: 0.1 % (ref 0.0–0.2)

## 2019-10-01 LAB — GLUCOSE, CAPILLARY
Glucose-Capillary: 116 mg/dL — ABNORMAL HIGH (ref 70–99)
Glucose-Capillary: 119 mg/dL — ABNORMAL HIGH (ref 70–99)
Glucose-Capillary: 114 mg/dL — ABNORMAL HIGH (ref 70–99)

## 2019-10-01 MED ORDER — POTASSIUM CHLORIDE CRYS ER 20 MEQ PO TBCR
40.0000 meq | EXTENDED_RELEASE_TABLET | Freq: Once | ORAL | Status: AC
  Administered 2019-10-01: 40 meq via ORAL
  Filled 2019-10-01: qty 2

## 2019-10-01 NOTE — Progress Notes (Addendum)
Patient Demographics:    Andre [name], is a 74 y.o. male, DOB - 17-Oct-1945, HA:[id number]  Admit date - 09/28/2019   Admitting Physician Libbie Bartley Denton Brick, MD  Outpatient Primary MD for the patient is Celene Squibb, MD  LOS - 3   Chief Complaint  Patient presents with  . Emesis        Subjective:    Andre [name] today has no fevers, no further emesis,  No chest pain,   --Having diarrhea -Leukocytosis noted  Assessment  & Plan :    Principal Problem:   SBO (small bowel obstruction) (HCC) Active Problems:   AKI (acute kidney injury) ([place])   Leukocytosis   Diarrhea in adult patient   History of TIAs   COPD (chronic obstructive pulmonary disease) (HCC)   Dehydration   Emesis, persistent  Brief Summary:- 74 y.o. 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 Arnoldo Morale) , no need for NG tube at this time, continue liquid diet -IV fluids until oral intake more reliable -CT abdomen and pelvis on 09/28/2019 with possible pSBO secondary to adhesions with transition point in the pelvis -Abdominal x-rays on 09/29/2019 and 09/30/2019 continues to show pSBO --WBC is up to 19.6  from 11.2, - lactic acid is not elevated -Previously received Dulcolax suppository General surgery consult from Dr. Aviva Signs General surgeon appreciated, -small bowel  follow-through protocol from 09/30/2019 suggest pSBO -He advised advancing diet as patient is clinically tolerating oral intake and having BMs ( diarrhea) -Diarrhea and persistent and worsening leukocytosis raises concern for infectious enterocolitis -Check stool GI pathogen -  2)AKI----acute kidney injury--- worsening renal function due to dehydration secondary to GI losses with vomiting or diarrhea PTA  --creatinine on admission= 1.89 , baseline creatinine =  0.9 to 1.0  , -Creatinine is  down to 0.9 with hydration --, renally adjust medications, avoid nephrotoxic agents / dehydration  / hypotension -Continue to hold Metformin, hold losartan  3)Intractable emesis---  resolved emesis, see #1 above, as needed Zofran -Continue IV fluids until oral intake is reliable  4)DM2-as per patient previously well controlled,  --hold Metformin and Januvia, Allow some permissive Hyperglycemia rather than risk life-threatening hypoglycemia in a patient with unreliable oral intake.  -Check Accu-Cheks/Fingersticks as ordered  5) history of COPD/tobacco abuse--stable, no acute exacerbation at this time --Not interested in smoking cessation  6) prior history of TIA--continue Lipitor and aspirin  7) persistent leukocytosis--WBC is 19.6, ----Diarrhea and persistent and worsening leukocytosis raises concern for infectious enterocolitis -Check stool GI pathogen -Blood cultures pending -UA pending  Disposition/Need for in-Hospital Stay- patient unable to be discharged at this time due to pSBO and AKI secondary to GI losses and poor oral intake ----requiring IV fluids pending  improvement in bowel function and oral intake -Persistent leukocytosis infection work-up pending -Patient From: home D/C Place: home Barriers: Not Clinically Stable- -Persistent leukocytosis infection work-up pending  Code Status : full  Family Communication:   (patient is alert, awake and coherent) -Male friend at  bedside  Consults  : general surgeon Dr. Arnoldo Morale  DVT Prophylaxis  :    - Heparin - SCDs   Lab Results  Component Value Date   PLT 422 (H) 10/01/2019    Inpatient Medications  Scheduled Meds: . aspirin  325 mg Oral Q breakfast  . atorvastatin  20 mg Oral QPM  . calcium carbonate  400 mg of elemental calcium Oral BID  . heparin  5,000 Units Subcutaneous Q8H  . pantoprazole  40 mg Oral Daily  . potassium chloride  40 mEq Oral Once  . sodium chloride flush  3 mL Intravenous Q12H  . traZODone  50 mg Oral QHS   Continuous Infusions: . sodium chloride    . sodium chloride 50 mL/hr at 10/01/19 1821   PRN Meds:.sodium chloride, morphine injection, ondansetron **OR** ondansetron (ZOFRAN) IV, sodium chloride flush    Anti-infectives (From admission, onward)   None        Objective:   Vitals:   09/30/19 1408 09/30/19 1953 09/30/19 2114 10/01/19 0536  BP: 117/82  124/73 123/72  Pulse: 70  68 79  Resp: 18  18 18   Temp: (!) 97.5 F (36.4 C)  99.2 F (37.3 C) 98.6 F (37 C)  TempSrc: Oral  Oral Oral  SpO2: 97% 97% 95% 97%  Weight:      Height:        Wt Readings from Last 3 Encounters:  09/28/19 95.3 kg  06/14/17 95.7 kg  12/29/15 87.1 kg     Intake/Output Summary (Last 24 hours) at 10/01/2019 1828 Last data filed at 10/01/2019 1500 Gross per 24 hour  Intake 480 ml  Output --  Net 480 ml    Physical Exam Physical Examination: General appearance - alert, well appearing, and in no distress  Mental status - alert, oriented to person, place, and time,  Eyes - sclera anicteric Neck - supple, no JVD elevation , Chest - clear  to auscultation bilaterally, symmetrical air movement,  Heart - S1 and S2 normal, regular  Abdomen -multiple scars including prior Kocher cholecystectomy scar, upper midline laparotomy scar, and lower abdominal and suprapubic area midline scar, abdomen is not distended, bowel sounds are somewhat diminished but present.  There is only mild  abdominal discomfort to palpation but no rebound or guarding---  Neurological - screening mental status exam normal, neck supple without rigidity, cranial nerves II through XII intact, DTR's normal and symmetric Extremities - no pedal edema noted, intact peripheral pulses  Skin - warm, dry   Data Review:   Micro Results Recent Results (from the past 240 hour(s))  Blood culture (routine x 2)     Status: None (Preliminary result)   Collection Time: 09/28/19 11:43 AM   Specimen: Left Antecubital; Blood  Result Value Ref Range Status   Specimen Description LEFT ANTECUBITAL BOTTLES DRAWN AEROBIC ONLY  Final   Special Requests Blood Culture adequate volume  Final   Culture   Final    NO GROWTH 3 DAYS Performed at [facility], [address] [place], [place] [address]    Report Status PENDING  Incomplete  Blood culture (routine x 2)     Status: None (Preliminary result)   Collection Time: 09/28/19 11:44  AM   Specimen: Right Antecubital; Blood  Result Value Ref Range Status   Specimen Description   Final    RIGHT ANTECUBITAL BOTTLES DRAWN AEROBIC AND ANAEROBIC   Special Requests Blood Culture adequate volume  Final   Culture   Final    NO GROWTH 3 DAYS Performed at [facility], [address] [place], [place] [address]    Report Status PENDING  Incomplete  Respiratory Panel by RT PCR (Flu A&B, Covid) - Nasopharyngeal Swab     Status: None   Collection Time: 09/28/19  1:14 PM   Specimen: Nasopharyngeal Swab  Result Value Ref Range Status   SARS Coronavirus 2 by RT PCR NEGATIVE NEGATIVE Final    Comment: (NOTE) SARS-CoV-2 target nucleic acids are NOT DETECTED. The SARS-CoV-2 RNA is generally detectable in upper respiratoy specimens during the acute phase of infection. The lowest concentration of SARS-CoV-2 viral copies this assay can detect is 131 copies/mL. A negative result does not preclude SARS-Cov-2 infection and should not be used as the sole basis for treatment or other  patient management decisions. A negative result may occur with  improper specimen collection/handling, submission of specimen other than nasopharyngeal swab, presence of viral mutation(s) within the areas targeted by this assay, and inadequate number of viral copies (<131 copies/mL). A negative result must be combined with clinical observations, patient history, and epidemiological information. The expected result is Negative. Fact Sheet for Patients:  PinkCheek.be Fact Sheet for Healthcare Providers:  GravelBags.it This test is not yet ap proved or cleared by the [place] FDA and  has been authorized for detection and/or diagnosis of SARS-CoV-2 by FDA under an Emergency Use Authorization (EUA). This EUA will remain  in effect (meaning this test can be used) for the duration of the COVID-19 declaration under Section 564(b)(1) of the Act, 21 U.S.C. section 360bbb-3(b)(1), unless the authorization is terminated or revoked sooner.    Influenza A by PCR NEGATIVE NEGATIVE Final   Influenza B by PCR NEGATIVE NEGATIVE Final    Comment: (NOTE) The Xpert Xpress SARS-CoV-2/FLU/RSV assay is intended as an aid in  the diagnosis of influenza from Nasopharyngeal swab specimens and  should not be used as a sole basis for treatment. Nasal washings and  aspirates are unacceptable for Xpert Xpress SARS-CoV-2/FLU/RSV  testing. Fact Sheet for Patients: PinkCheek.be Fact Sheet for Healthcare Providers: GravelBags.it This test is not yet approved or cleared by the [place] FDA and  has been authorized for detection and/or diagnosis of SARS-CoV-2 by  FDA under an Emergency Use Authorization (EUA). This EUA will remain  in effect (meaning this test can be used) for the duration of the  Covid-19 declaration under Section 564(b)(1) of the Act, 21  U.S.C. section 360bbb-3(b)(1), unless  the authorization is  terminated or revoked. Performed at [facility], [address] [place], [place] [address]     Radiology Reports CT ABDOMEN PELVIS W CONTRAST  Result Date: 09/28/2019 CLINICAL DATA:  74 year old male with abdominal and pelvic pain with diarrhea for 6 days. Vomiting for 3 days. EXAM: CT ABDOMEN AND PELVIS WITH CONTRAST TECHNIQUE: Multidetector CT imaging of the abdomen and pelvis was performed using the standard protocol following bolus administration of intravenous contrast. CONTRAST:  2mL OMNIPAQUE IOHEXOL 300 MG/ML  SOLN COMPARISON:  None. FINDINGS: Lower chest: No acute abnormality. Hepatobiliary: The patient is status post cholecystectomy. Hepatic steatosis is identified without focal hepatic lesion. No biliary dilatation. Pancreas: Mildly atrophic without other abnormality. Spleen: Spleen is not identified  compatible with prior splenectomy. Adrenals/Urinary Tract: The kidneys, adrenal glands and bladder are unremarkable except for a probable small LEFT renal cyst. Stomach/Bowel: Distended proximal mid small bowel loops are noted with nondistended but gas and fluid-filled distal loops of small bowel. Gas and fluid in the colon noted. There is a relative transition point within the anterior pelvis (series 2: Image 66) likely representing an partial small bowel obstruction, probably from an adhesion. Colonic diverticulosis noted without evidence of diverticulitis. No bowel wall thickening or pneumoperitoneum. What appears to be the appendix is normal. Vascular/Lymphatic: Aortic atherosclerosis. No enlarged abdominal or pelvic lymph nodes. Reproductive: Mildly prominent prostate noted. Other: No ascites or focal collection/abscess. Musculoskeletal: No acute or suspicious bony abnormalities are noted. Mild degenerative changes in the lumbar spine are present. IMPRESSION: 1. Distended proximal and mid small bowel loops with relative transition point within the anterior pelvis, likely  representing a partial small bowel obstruction, probably from an adhesion. No ascites, pneumoperitoneum or abscess. 2. Hepatic steatosis. 3. Aortic Atherosclerosis (ICD10-I70.0). Electronically Signed   By: Margarette Canada M.D.   On: 09/28/2019 10:15   DG Abd 2 Views  Result Date: 09/30/2019 CLINICAL DATA:  Nausea, vomiting, diarrhea since 09/23/2019, diabetes EXAM: ABDOMEN - 2 VIEW COMPARISON:  09/29/2019 FINDINGS: Supine and upright frontal views of the abdomen and pelvis are obtained. Nonspecific gaseous distension of loops of small bowel in the central abdomen are again seen, measuring up to 3.8 cm in diameter. There is gas throughout the colon to the level of the rectum. Minimal scattered gas fluid levels. No free gas in the greater peritoneal sac. Lung bases are clear. IMPRESSION: 1. Stable small bowel dilatation within the central abdomen, which may reflect early/intermittent obstruction. Bowel gas again seen throughout the colon to the level of the rectum. Electronically Signed   By: Randa Ngo M.D.   On: 09/30/2019 09:56   DG Abd 2 Views  Result Date: 09/29/2019 CLINICAL DATA:  Abdominal pain. EXAM: ABDOMEN - 2 VIEW COMPARISON:  None. FINDINGS: Prominent loops of small bowel, out of proportion to the colonic bowel gas pattern. There is air throughout the length of the colon. No free air, portal venous gas, or pneumatosis. Mild bibasilar pulmonary opacities. IMPRESSION: 1. Findings are concerning for early or partial small bowel obstruction. Recommend clinical correlation and attention on follow-up. 2. Mild bibasilar pulmonary opacities. Electronically Signed   By: Dorise Bullion III M.D   On: 09/29/2019 14:11   DG Abd Portable 1V-Small Bowel Obstruction Protocol-24 hr delay  Result Date: 10/01/2019 CLINICAL DATA:  Small bowel obstruction EXAM: PORTABLE ABDOMEN - 1 VIEW COMPARISON:  Abdominal radiograph September 30, 2019; CT abdomen and pelvis September 28, 2019. FINDINGS: There has been interval  resolution of small bowel dilatation compared to 1 day prior. On the current examination, there is moderate stool in the colon. There is no bowel dilatation or air-fluid level to suggest bowel obstruction. No free air. There are apparent phleboliths in the pelvis. There are surgical clips in the right upper ABS IMPRESSION: There is no longer appreciable bowel dilatation. Appearance is felt to be indicative of resolution of bowel obstruction. No evident free air. Electronically Signed   By: Lowella Grip III M.D.   On: 10/01/2019 14:29   DG Abd Portable 1V-Small Bowel Obstruction Protocol-initial, 8 hr delay  Result Date: 09/30/2019 CLINICAL DATA:  Small-bowel obstruction, 8 hour follow-up film EXAM: PORTABLE ABDOMEN - 1 VIEW COMPARISON:  Film from earlier in the same day, CT from 01/28/2019  FINDINGS: Scattered large and small bowel gas is noted. A few persistently dilated loops of small bowel are noted up to 4 cm. No free air is seen. Contrast material has passed through to the colon consistent with a partial small bowel obstruction. No other focal abnormality is noted. IMPRESSION: Changes consistent with partial small bowel obstruction. Electronically Signed   By: Inez Catalina M.D.   On: 09/30/2019 22:25     CBC Recent Labs  Lab 09/28/19 0733 09/29/19 0732 09/30/19 0451 10/01/19 0444  WBC 11.2* 13.0* 17.1* 19.6*  HGB 16.4 14.1 14.7 14.2  HCT 48.1 42.4 43.9 42.0  PLT 370 379 413* 422*  MCV 92.0 93.6 93.0 91.9  MCH 31.4 31.1 31.1 31.1  MCHC 34.1 33.3 33.5 33.8  RDW 13.3 13.9 13.8 14.0  LYMPHSABS 2.5  --   --   --   MONOABS 2.5*  --   --   --   EOSABS 0.2  --   --   --   BASOSABS 0.1  --   --   --     Chemistries  Recent Labs  Lab 09/28/19 0733 09/29/19 0732 09/30/19 0451 10/01/19 0444  NA 131* 136 136 138  K 3.9 3.9 3.5 3.4*  CL 98 107 105 107  CO2 20* 18* 19* 19*  GLUCOSE 120* 90 109* 103*  BUN 52* 21 12 9   CREATININE 1.89* 1.00 1.19 0.95  CALCIUM 8.4* 7.6* 8.2* 8.6*    AST 15  --   --  19  ALT 19  --   --  20  ALKPHOS 45  --   --  45  BILITOT 0.9  --   --  0.5   ------------------------------------------------------------------------------------------------------------------ No results for input(s): CHOL, HDL, LDLCALC, TRIG, CHOLHDL, LDLDIRECT in the last 72 hours.  No results found for: HGBA1C ------------------------------------------------------------------------------------------------------------------ No results for input(s): TSH, T4TOTAL, T3FREE, THYROIDAB in the last 72 hours.  Invalid input(s): FREET3 ------------------------------------------------------------------------------------------------------------------ No results for input(s): VITAMINB12, FOLATE, FERRITIN, TIBC, IRON, RETICCTPCT in the last 72 hours.  Coagulation profile No results for input(s): INR, PROTIME in the last 168 hours.  No results for input(s): DDIMER in the last 72 hours.  Cardiac Enzymes No results for input(s): CKMB, TROPONINI, MYOGLOBIN in the last 168 hours.  Invalid input(s): CK ------------------------------------------------------------------------------------------------------------------ No results found for: BNP   Roxan Hockey M.D on 10/01/2019 at 6:28 PM  Go to [email or web address] - for contact info  [facility] - Office  [phone]

## 2019-10-01 NOTE — Consult Note (Signed)
Reason for Consult: Partial small bowel obstruction Referring Physician: Dr. Shelah Lewandowsky is an 74 y.o. male.  HPI: Patient is a 74 year old white male who was admitted to the hospital for diarrhea and renal insufficiency.  He initially was found on CAT scan to have dilated small bowel loops, consistent with possible bowel obstruction.  The patient denies any nausea or vomiting.  His small bowel dilatation has persisted.  Stool cultures have been negative.  Patient continues to have diarrhea.  I initially was curbside consulted and ordered a small bowel protocol follow-through.  This was performed yesterday.  He does have persistent small bowel dilatation, but there is air and contrast throughout the colon.  Patient has had 8 bowel movements yesterday.  He has been on a clear liquid diet and denies any nausea or vomiting.  He states he has had this episode in the remote past.  He did take an Imodium pill last week when he was having some diarrhea.  He currently has no abdominal pain.  Past Medical History:  Diagnosis Date  . Adenomatous polyp 2002   tcs by [name]  . Diabetes mellitus without complication ([place])   . Diverticula of colon 2002   L side  . GERD (gastroesophageal reflux disease)   . Hemorrhoids 2002   internal and external  . Hiatal hernia 2002   moderate  . Hyperlipidemia   . Hypertension   . Mini stroke ([place]) 1998   no deficits  . Sleep apnea    does not use CPAP. PCP aware.    Past Surgical History:  Procedure Laterality Date  . CATARACT EXTRACTION W/PHACO Left 12/31/2015   Procedure: CATARACT EXTRACTION PHACO AND INTRAOCULAR LENS PLACEMENT LEFT EYE; CDE: 17.28;  Surgeon: [name], MD;  Location: [facility];  Service: Ophthalmology;  Laterality: Left;  . CHOLECYSTECTOMY  1990   [name]  . COLONOSCOPY  01/23/01   [name]- small internal and external hemorrhoids, L sided mild diverticula. adenomatous polyp removed from rectum.  . COLONOSCOPY  12/26/2011    Procedure: COLONOSCOPY;  Surgeon: [name], MD;  Location: [facility];  Service: Endoscopy;  Laterality: N/A;  8:30  . ESOPHAGOGASTRODUODENOSCOPY  01/23/01   Dr. [name] hiatal hernia, mild to moderate gastritis  . EXPLORATORY LAPAROTOMY     with lysis of adhesions  . 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 Hyperlipidemia Mother   . Heart disease Mother   . Hypertension Mother   . Stroke Mother   . Heart disease Father        MI  . Colon cancer Neg Hx     Social History:  reports that he has been smoking cigarettes. He has a 25.00 pack-year smoking history. He has never used smokeless tobacco. He reports current alcohol use. He reports that he does not use drugs.  Allergies: No Known Allergies  Medications:  Scheduled: . aspirin  325 mg Oral Q breakfast  . atorvastatin  20 mg Oral QPM  . calcium carbonate  400 mg of elemental calcium Oral BID  . heparin  5,000 Units Subcutaneous Q8H  . pantoprazole  40 mg Oral Daily  . sodium chloride flush  3 mL Intravenous Q12H  . traZODone  50 mg Oral QHS    Results for orders placed or performed during the hospital encounter of 09/28/19 (from the past 48 hour(s))  Glucose, capillary  Status: Abnormal   Collection Time: 09/29/19 11:08 AM  Result Value Ref Range   Glucose-Capillary 108 (H) 70 - 99 mg/dL    Comment: Glucose reference range applies only to samples taken after fasting for at least 8 hours.  Glucose, capillary     Status: None   Collection Time: 09/29/19  4:24 PM  Result Value Ref Range   Glucose-Capillary 83 70 - 99 mg/dL    Comment: Glucose reference range applies only to samples taken after fasting for at least 8 hours.  Glucose, capillary     Status: Abnormal   Collection Time: 09/29/19  9:33 PM  Result Value Ref Range   Glucose-Capillary 107 (H) 70 - 99 mg/dL    Comment: Glucose reference range applies only to samples taken after fasting for at  least 8 hours.   Comment 1 Notify RN    Comment 2 Document in Chart   CBC     Status: Abnormal   Collection Time: 09/30/19  4:51 AM  Result Value Ref Range   WBC 17.1 (H) 4.0 - 10.5 K/uL   RBC 4.72 4.22 - 5.81 MIL/uL   Hemoglobin 14.7 13.0 - 17.0 g/dL   HCT 43.9 39.0 - 52.0 %   MCV 93.0 80.0 - 100.0 fL   MCH 31.1 26.0 - 34.0 pg   MCHC 33.5 30.0 - 36.0 g/dL   RDW 13.8 11.5 - 15.5 %   Platelets 413 (H) 150 - 400 K/uL   nRBC 0.0 0.0 - 0.2 %    Comment: Performed at [facility], [address], [place], [place] [id number]  Basic metabolic panel     Status: Abnormal   Collection Time: 09/30/19  4:51 AM  Result Value Ref Range   Sodium 136 135 - 145 mmol/L   Potassium 3.5 3.5 - 5.1 mmol/L   Chloride 105 98 - 111 mmol/L   CO2 19 (L) 22 - 32 mmol/L   Glucose, Bld 109 (H) 70 - 99 mg/dL    Comment: Glucose reference range applies only to samples taken after fasting for at least 8 hours.   BUN 12 8 - 23 mg/dL   Creatinine, Ser 1.19 0.61 - 1.24 mg/dL   Calcium 8.2 (L) 8.9 - 10.3 mg/dL   GFR calc non Af Amer >60 >60 mL/min   GFR calc Af Amer >60 >60 mL/min   Anion gap 12 5 - 15    Comment: Performed at [facility], [address] [place], [place] [address]  Glucose, capillary     Status: Abnormal   Collection Time: 09/30/19  7:49 AM  Result Value Ref Range   Glucose-Capillary 103 (H) 70 - 99 mg/dL    Comment: Glucose reference range applies only to samples taken after fasting for at least 8 hours.  Glucose, capillary     Status: Abnormal   Collection Time: 09/30/19 11:07 AM  Result Value Ref Range   Glucose-Capillary 101 (H) 70 - 99 mg/dL    Comment: Glucose reference range applies only to samples taken after fasting for at least 8 hours.  Glucose, capillary     Status: None   Collection Time: 09/30/19  4:35 PM  Result Value Ref Range   Glucose-Capillary 93 70 - 99 mg/dL    Comment: Glucose reference range applies only to samples taken after fasting for at least 8 hours.  Glucose,  capillary     Status: Abnormal   Collection Time: 09/30/19  9:15 PM  Result Value Ref Range  Glucose-Capillary 120 (H) 70 - 99 mg/dL    Comment: Glucose reference range applies only to samples taken after fasting for at least 8 hours.   Comment 1 Notify RN    Comment 2 Document in Chart   CBC     Status: Abnormal   Collection Time: 10/01/19  4:44 AM  Result Value Ref Range   WBC 19.6 (H) 4.0 - 10.5 K/uL   RBC 4.57 4.22 - 5.81 MIL/uL   Hemoglobin 14.2 13.0 - 17.0 g/dL   HCT 42.0 39.0 - 52.0 %   MCV 91.9 80.0 - 100.0 fL   MCH 31.1 26.0 - 34.0 pg   MCHC 33.8 30.0 - 36.0 g/dL   RDW 14.0 11.5 - 15.5 %   Platelets 422 (H) 150 - 400 K/uL   nRBC 0.0 0.0 - 0.2 %    Comment: Performed at [facility], [address], [place], [place] [address]  Comprehensive metabolic panel     Status: Abnormal   Collection Time: 10/01/19  4:44 AM  Result Value Ref Range   Sodium 138 135 - 145 mmol/L   Potassium 3.4 (L) 3.5 - 5.1 mmol/L   Chloride 107 98 - 111 mmol/L   CO2 19 (L) 22 - 32 mmol/L   Glucose, Bld 103 (H) 70 - 99 mg/dL    Comment: Glucose reference range applies only to samples taken after fasting for at least 8 hours.   BUN 9 8 - 23 mg/dL   Creatinine, Ser 0.95 0.61 - 1.24 mg/dL   Calcium 8.6 (L) 8.9 - 10.3 mg/dL   Total Protein 6.9 6.5 - 8.1 g/dL   Albumin 3.0 (L) 3.5 - 5.0 g/dL   AST 19 15 - 41 U/L   ALT 20 0 - 44 U/L   Alkaline Phosphatase 45 38 - 126 U/L   Total Bilirubin 0.5 0.3 - 1.2 mg/dL   GFR calc non Af Amer >60 >60 mL/min   GFR calc Af Amer >60 >60 mL/min   Anion gap 12 5 - 15    Comment: Performed at [facility], [address] [place], [place] [address]  Glucose, capillary     Status: Abnormal   Collection Time: 10/01/19  8:00 AM  Result Value Ref Range   Glucose-Capillary 116 (H) 70 - 99 mg/dL    Comment: Glucose reference range applies only to samples taken after fasting for at least 8 hours.    DG Abd 2 Views  Result Date: 09/30/2019 CLINICAL DATA:  Nausea,  vomiting, diarrhea since 09/23/2019, diabetes EXAM: ABDOMEN - 2 VIEW COMPARISON:  09/29/2019 FINDINGS: Supine and upright frontal views of the abdomen and pelvis are obtained. Nonspecific gaseous distension of loops of small bowel in the central abdomen are again seen, measuring up to 3.8 cm in diameter. There is gas throughout the colon to the level of the rectum. Minimal scattered gas fluid levels. No free gas in the greater peritoneal sac. Lung bases are clear. IMPRESSION: 1. Stable small bowel dilatation within the central abdomen, which may reflect early/intermittent obstruction. Bowel gas again seen throughout the colon to the level of the rectum. Electronically Signed   By: Randa Ngo M.D.   On: 09/30/2019 09:56   DG Abd 2 Views  Result Date: 09/29/2019 CLINICAL DATA:  Abdominal pain. EXAM: ABDOMEN - 2 VIEW COMPARISON:  None. FINDINGS: Prominent loops of small bowel, out of proportion to the colonic bowel gas pattern. There is air throughout the length of the colon. No free air,  portal venous gas, or pneumatosis. Mild bibasilar pulmonary opacities. IMPRESSION: 1. Findings are concerning for early or partial small bowel obstruction. Recommend clinical correlation and attention on follow-up. 2. Mild bibasilar pulmonary opacities. Electronically Signed   By: Dorise Bullion III M.D   On: 09/29/2019 14:11   DG Abd Portable 1V-Small Bowel Obstruction Protocol-initial, 8 hr delay  Result Date: 09/30/2019 CLINICAL DATA:  Small-bowel obstruction, 8 hour follow-up film EXAM: PORTABLE ABDOMEN - 1 VIEW COMPARISON:  Film from earlier in the same day, CT from 01/28/2019 FINDINGS: Scattered large and small bowel gas is noted. A few persistently dilated loops of small bowel are noted up to 4 cm. No free air is seen. Contrast material has passed through to the colon consistent with a partial small bowel obstruction. No other focal abnormality is noted. IMPRESSION: Changes consistent with partial small bowel  obstruction. Electronically Signed   By: Inez Catalina M.D.   On: 09/30/2019 22:25    ROS:  Pertinent items are noted in HPI.  Blood pressure 123/72, pulse 79, temperature 98.6 F (37 C), temperature source Oral, resp. rate 18, height 6\' 2"  (1.88 m), weight 95.3 kg, SpO2 97 %. Physical Exam: Pleasant white male no acute distress Head is normocephalic, atraumatic Lungs clear to auscultation with good breath sounds bilaterally Heart examination reveals regular rate and rhythm without S3, S4, murmurs Abdomen is soft, nontender, nondistended.  Active bowel sounds appreciated.  No rigidity is noted.  CT scan and small bowel KUB personally reviewed  Assessment/Plan: Impression: Partial small bowel obstruction, but not mechanical.  Diarrhea of unknown etiology.  Leukocytosis of unknown etiology.  Possible enteritis and patient may benefit from antibiotic therapy like ciprofloxacin. Plan: At this point, no acute surgical intervention warranted.  Will advance to heart healthy/carb modified diet.  Will stop MiraLAX.  Will follow with you.  Aviva Signs 10/01/2019, 9:12 AM

## 2019-10-01 NOTE — Plan of Care (Signed)
  Problem: Education: Goal: Knowledge of General Education information will improve Description: Including pain rating scale, medication(s)/side effects and non-pharmacologic comfort measures Outcome: Adequate for Discharge   Problem: Health Behavior/Discharge Planning: Goal: Ability to manage health-related needs will improve Outcome: Adequate for Discharge   Problem: Nutrition: Goal: Adequate nutrition will be maintained Outcome: Adequate for Discharge   Problem: Coping: Goal: Level of anxiety will decrease Outcome: Adequate for Discharge   Problem: Elimination: Goal: Will not experience complications related to bowel motility Outcome: Adequate for Discharge   Problem: Safety: Goal: Ability to remain free from injury will improve Outcome: Adequate for Discharge   Problem: Skin Integrity: Goal: Risk for impaired skin integrity will decrease Outcome: Adequate for Discharge

## 2019-10-02 DIAGNOSIS — N179 Acute kidney failure, unspecified: Secondary | ICD-10-CM

## 2019-10-02 DIAGNOSIS — E860 Dehydration: Secondary | ICD-10-CM

## 2019-10-02 DIAGNOSIS — R1115 Cyclical vomiting syndrome unrelated to migraine: Secondary | ICD-10-CM

## 2019-10-02 DIAGNOSIS — Z8673 Personal history of transient ischemic attack (TIA), and cerebral infarction without residual deficits: Secondary | ICD-10-CM

## 2019-10-02 DIAGNOSIS — J449 Chronic obstructive pulmonary disease, unspecified: Secondary | ICD-10-CM

## 2019-10-02 DIAGNOSIS — D72829 Elevated white blood cell count, unspecified: Secondary | ICD-10-CM

## 2019-10-02 DIAGNOSIS — R197 Diarrhea, unspecified: Secondary | ICD-10-CM

## 2019-10-02 LAB — COMPREHENSIVE METABOLIC PANEL
ALT: 23 U/L (ref 0–44)
AST: 18 U/L (ref 15–41)
Albumin: 2.9 g/dL — ABNORMAL LOW (ref 3.5–5.0)
Alkaline Phosphatase: 44 U/L (ref 38–126)
Anion gap: 11 (ref 5–15)
BUN: 9 mg/dL (ref 8–23)
CO2: 20 mmol/L — ABNORMAL LOW (ref 22–32)
Calcium: 8.7 mg/dL — ABNORMAL LOW (ref 8.9–10.3)
Chloride: 105 mmol/L (ref 98–111)
Creatinine, Ser: 1.02 mg/dL (ref 0.61–1.24)
GFR calc Af Amer: >60 mL/min (ref >60)
GFR calc non Af Amer: >60 mL/min (ref >60)
Glucose, Bld: 124 mg/dL — ABNORMAL HIGH (ref 70–99)
Potassium: 3.7 mmol/L (ref 3.5–5.1)
Sodium: 136 mmol/L (ref 135–145)
Total Bilirubin: 0.7 mg/dL (ref 0.3–1.2)
Total Protein: 7 g/dL (ref 6.5–8.1)

## 2019-10-02 LAB — URINALYSIS, ROUTINE W REFLEX MICROSCOPIC
Bacteria, UA: NONE SEEN
Bilirubin Urine: NEGATIVE
Glucose, UA: NEGATIVE mg/dL
Ketones, ur: NEGATIVE mg/dL
Leukocytes,Ua: NEGATIVE
Nitrite: NEGATIVE
Protein, ur: NEGATIVE mg/dL
Specific Gravity, Urine: 1.017 (ref 1.005–1.030)
pH: 5 (ref 5.0–8.0)

## 2019-10-02 LAB — GASTROINTESTINAL PANEL BY PCR, STOOL (REPLACES STOOL CULTURE)

## 2019-10-02 LAB — GLUCOSE, CAPILLARY: Glucose-Capillary: 123 mg/dL — ABNORMAL HIGH (ref 70–99)

## 2019-10-02 LAB — MAGNESIUM: Magnesium: 1.6 mg/dL — ABNORMAL LOW (ref 1.7–2.4)

## 2019-10-02 MED ORDER — CALCIUM CARBONATE ANTACID 500 MG PO CHEW: 400.0000 mg | CHEWABLE_TABLET | Freq: Two times a day (BID) | ORAL | Status: DC

## 2019-10-02 NOTE — Plan of Care (Signed)

## 2019-10-02 NOTE — Discharge Instructions (Signed)

## 2019-10-02 NOTE — Progress Notes (Signed)
Pt discharged ambulatory per his request accompanied by staff member to [place]. All belongings in his possession at time of discharge.

## 2019-10-02 NOTE — Progress Notes (Signed)
Met patient out walking in hallway. Pt had turned off his IV pump, disconnected the IV tubing from his saline lock. Pt states, "I'm going for a walk. I'm getting out of here today. I'm better and I'm tired of just laying around." Pt ambulated entire hallway x2, back to room. Up in chair, about to read newspaper. Pt denies c/o.

## 2019-10-02 NOTE — Discharge Summary (Signed)
Physician Discharge Summary  Andre [name] [id number] DOB: 1945-07-24 DOA: 09/28/2019  PCP: Celene Squibb, MD  Admit date: 09/28/2019 Discharge date: 10/02/2019  Admitted From:  Home  Disposition:  Home   Recommendations for Outpatient Follow-up:  1. Follow up with PCP in 1 weeks 2. Please find out please follow-up on final GI pathogen panel results  Discharge Condition: STABLE   CODE STATUS: FULL    Brief Hospitalization Summary: Please see all hospital notes, images, labs for full details of the hospitalization. ADMISSION HPI:   Andre [name]  is a 74 y.o. male with past medical history relevant for COPD, prior episodes of bowel obstruction due to adhesions, DM2, tobacco abuse, prior history of TIA, status post multiple laparotomies in the past including cholecystectomy in the early 1990s, laparotomy back and not [place], and surgery for SBO secondary to adhesions more than 25 years ago presents to the ED with intractable emesis since 09/23/2019--at the advice of his PCP patient was drinking Gatorade and taking Imodium since 09/25/2019 -Feels dry, weak and tired -CT in the ED consistent with possible small bowel obstruction versus ileus with transition point in the pelvic area -EDP discussed case with general surgeon who recommended no NG tube at this time and okay to give liquid diet -WBC was 11.2 lactic acid was 1.4, creatinine was up to 1.89 from a baseline around 1  -Emesis was apparently non-bloody, patient started to have multiple BMs after contrast study today - Patient requesting to be fed, says she is very hungry and dehydrated =-No chest pains palpitations dizziness or shortness of breath -No leg pains or pleuritic symptoms  -CT abdomen and pelvis IMPRESSION: 1. Distended proximal and mid small bowel loops with relative transition point within the anterior pelvis, likely representing a partial small bowel obstruction, probably from an adhesion. No ascites,  pneumoperitoneum or abscess.  Brief Summary:- 74 y.o.malewith past medical history relevant for COPD, prior episodes of bowel obstruction due to adhesions,DM2, tobacco abuse, prior history of TIA,status post multiple laparotomies in the past including cholecystectomy in the early 1990s, laparotomy back and not [place], and surgery for SBO secondary to adhesions more than 25years ago presents to the ED with intractable emesis since 09/23/2019--at the advice of his PCP patient was drinking Gatorade and taking Imodium since 09/25/2019 admitted on 09/28/19 for pSBO  A/p 1)PSBO Vs Ileus---HE IS FEELING BETTER AND WANTS TO GO HOME.  CT abdomen and pelvis on 09/28/2019 with possiblepSBOsecondary to adhesions with transition point in thepelvis -Abdominal x-rays on 09/29/2019 and 09/30/2019 continues to show pSBO - lactic acid is not elevated -Previously received Dulcolax suppository General surgery consult from Dr. Aviva Signs General surgeon appreciated, -small bowel follow-through protocol from 09/30/2019 suggested pSBO however this has resolved -He is tolerating soft diet now.  -Diarrhea has resolved now and patient wants to go home -stool GI pathogen still pending, however diarrhea has resolved   2)AKI RESOLVED--was due to dehydration secondary to GI losses with vomiting or diarrhea PTA   --creatinine on admission=1.89 , baseline creatinine =0.9 to 1.0 -Creatinine is  down to 0.9 with hydration --, renally adjust medications, avoid nephrotoxic agents / dehydration / hypotension -resume home Metformin, losartan  3)Intractable emesis--- RESOLVED   4)DM2-as per patient previously well controlled, --hold Metformin and Januvia,Allow some permissive Hyperglycemia rather than risk life-threatening hypoglycemia in a patient with unreliable oral intake. -CheckAccu-Cheks/Fingersticks as ordered  5)history of COPD/tobacco abuse--stable, no acute exacerbation at this time --Not  interested in smoking cessation,  Tobacco Use: High Risk  . Smoking Tobacco Use: Current Every Day Smoker  . Smokeless Tobacco Use: Never Used    6)prior history of TIA--continue Lipitor and aspirin  7) persistent leukocytosis--WBC is 19.6, --Diarrhea and persistent and worsening leukocytosis raises concern for infectious enterocolitis -Check stool GI pathogen -Blood cultures pending -UA: no active infection seen.  -Patient From: home D/C Place: home  Code Status : full  Discharge Diagnoses:  Principal Problem:   SBO (small bowel obstruction) ([place]) Active Problems:   History of TIAs   COPD (chronic obstructive pulmonary disease) ([facility])   AKI (acute kidney injury) ([place])   Dehydration   Emesis, persistent   Leukocytosis   Diarrhea in adult patient   Discharge Instructions:  Allergies as of 10/02/2019   No Known Allergies     Medication List    STOP taking these medications   erythromycin ophthalmic ointment   HYDROcodone-acetaminophen 5-325 MG tablet Commonly known as: NORCO/VICODIN   traZODone 50 MG tablet Commonly known as: DESYREL     TAKE these medications   aspirin 325 MG tablet Take 325 mg by mouth daily.   atorvastatin 20 MG tablet Commonly known as: LIPITOR TAKE (1) TABLET BY MOUTH ONCE DAILY. What changed: See the new instructions.   calcium carbonate 500 MG chewable tablet Commonly known as: TUMS - dosed in mg elemental calcium Chew 2 tablets (400 mg of elemental calcium total) by mouth 2 (two) times daily.   diphenoxylate-atropine 2.5-0.025 MG tablet Commonly known as: LOMOTIL Take 1 tablet by mouth in the morning and at bedtime.   Januvia 100 MG tablet Generic drug: sitaGLIPtin Take 100 mg by mouth daily.   losartan 50 MG tablet Commonly known as: COZAAR Take 50 mg by mouth daily. What changed: Another medication with the same name was removed. Continue taking this medication, and follow the directions you see here.   metFORMIN  500 MG tablet Commonly known as: GLUCOPHAGE Take 500 mg by mouth 2 (two) times daily.   omeprazole 20 MG capsule Commonly known as: PRILOSEC TAKE 1 CAPSULE BY MOUTH ONCE A DAY.   ondansetron 4 MG tablet Commonly known as: ZOFRAN Take 4 mg by mouth every 6 (six) hours as needed for nausea or vomiting.       No Known Allergies Allergies as of 10/02/2019   No Known Allergies     Medication List    STOP taking these medications   erythromycin ophthalmic ointment   HYDROcodone-acetaminophen 5-325 MG tablet Commonly known as: NORCO/VICODIN   traZODone 50 MG tablet Commonly known as: DESYREL     TAKE these medications   aspirin 325 MG tablet Take 325 mg by mouth daily.   atorvastatin 20 MG tablet Commonly known as: LIPITOR TAKE (1) TABLET BY MOUTH ONCE DAILY. What changed: See the new instructions.   calcium carbonate 500 MG chewable tablet Commonly known as: TUMS - dosed in mg elemental calcium Chew 2 tablets (400 mg of elemental calcium total) by mouth 2 (two) times daily.   diphenoxylate-atropine 2.5-0.025 MG tablet Commonly known as: LOMOTIL Take 1 tablet by mouth in the morning and at bedtime.   Januvia 100 MG tablet Generic drug: sitaGLIPtin Take 100 mg by mouth daily.   losartan 50 MG tablet Commonly known as: COZAAR Take 50 mg by mouth daily. What changed: Another medication with the same name was removed. Continue taking this medication, and follow the directions you see here.   metFORMIN 500 MG tablet Commonly known as: GLUCOPHAGE Take  500 mg by mouth 2 (two) times daily.   omeprazole 20 MG capsule Commonly known as: PRILOSEC TAKE 1 CAPSULE BY MOUTH ONCE A DAY.   ondansetron 4 MG tablet Commonly known as: ZOFRAN Take 4 mg by mouth every 6 (six) hours as needed for nausea or vomiting.       Procedures/Studies: CT ABDOMEN PELVIS W CONTRAST  Result Date: 09/28/2019 CLINICAL DATA:  74 year old male with abdominal and pelvic pain with diarrhea  for 6 days. Vomiting for 3 days. EXAM: CT ABDOMEN AND PELVIS WITH CONTRAST TECHNIQUE: Multidetector CT imaging of the abdomen and pelvis was performed using the standard protocol following bolus administration of intravenous contrast. CONTRAST:  22mL OMNIPAQUE IOHEXOL 300 MG/ML  SOLN COMPARISON:  None. FINDINGS: Lower chest: No acute abnormality. Hepatobiliary: The patient is status post cholecystectomy. Hepatic steatosis is identified without focal hepatic lesion. No biliary dilatation. Pancreas: Mildly atrophic without other abnormality. 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 Adrenals/Urinary Tract: The kidneys, adrenal glands and bladder are unremarkable except for a probable small LEFT renal cyst. Stomach/Bowel: Distended proximal mid small bowel loops are noted with nondistended but gas and fluid-filled distal loops of small bowel. Gas and fluid in the colon noted. There is a relative transition point within the anterior pelvis (series 2: Image 66) likely representing an partial small bowel obstruction, probably from an adhesion. Colonic diverticulosis noted without evidence of diverticulitis. No bowel wall thickening or pneumoperitoneum. What appears to be the appendix is normal. Vascular/Lymphatic: Aortic atherosclerosis. No enlarged abdominal or pelvic lymph nodes. Reproductive: Mildly prominent prostate noted. Other: No ascites or focal collection/abscess. Musculoskeletal: No acute or suspicious bony abnormalities are noted. Mild degenerative changes in the lumbar spine are present. IMPRESSION: 1. Distended proximal and mid small bowel loops with relative transition point within the anterior pelvis, likely representing a partial small bowel obstruction, probably from an adhesion. No ascites, pneumoperitoneum or abscess. 2. Hepatic steatosis. 3. Aortic Atherosclerosis (ICD10-I70.0). Electronically Signed   By: Margarette Canada M.D.   On: 09/28/2019 10:15   DG Abd 2 Views  Result  Date: 09/30/2019 CLINICAL DATA:  Nausea, vomiting, diarrhea since 09/23/2019, diabetes EXAM: ABDOMEN - 2 VIEW COMPARISON:  09/29/2019 FINDINGS: Supine and upright frontal views of the abdomen and pelvis are obtained. Nonspecific gaseous distension of loops of small bowel in the central abdomen are again seen, measuring up to 3.8 cm in diameter. There is gas throughout the colon to the level of the rectum. Minimal scattered gas fluid levels. No free gas in the greater peritoneal sac. Lung bases are clear. IMPRESSION: 1. Stable small bowel dilatation within the central abdomen, which may reflect early/intermittent obstruction. Bowel gas again seen throughout the colon to the level of the rectum. Electronically Signed   By: Randa Ngo M.D.   On: 09/30/2019 09:56   DG Abd 2 Views  Result Date: 09/29/2019 CLINICAL DATA:  Abdominal pain. EXAM: ABDOMEN - 2 VIEW COMPARISON:  None. FINDINGS: Prominent loops of small bowel, out of proportion to the colonic bowel gas pattern. There is air throughout the length of the colon. No free air, portal venous gas, or pneumatosis. Mild bibasilar pulmonary opacities. IMPRESSION: 1. Findings are concerning for early or partial small bowel obstruction. Recommend clinical correlation and attention on follow-up. 2. Mild bibasilar pulmonary opacities. Electronically Signed   By: Dorise Bullion III M.D   On: 09/29/2019 14:11   DG Abd Portable 1V-Small Bowel Obstruction Protocol-24 hr delay  Result Date: 10/01/2019 CLINICAL DATA:  Small bowel obstruction EXAM: PORTABLE ABDOMEN - 1 VIEW COMPARISON:  Abdominal radiograph September 30, 2019; CT abdomen and pelvis September 28, 2019. FINDINGS: There has been interval resolution of small bowel dilatation compared to 1 day prior. On the current examination, there is moderate stool in the colon. There is no bowel dilatation or air-fluid level to suggest bowel obstruction. No free air. There are apparent phleboliths in the pelvis. There are  surgical clips in the right upper ABS IMPRESSION: There is no longer appreciable bowel dilatation. Appearance is felt to be indicative of resolution of bowel obstruction. No evident free air. Electronically Signed   By: Lowella Grip III M.D.   On: 10/01/2019 14:29   DG Abd Portable 1V-Small Bowel Obstruction Protocol-initial, 8 hr delay  Result Date: 09/30/2019 CLINICAL DATA:  Small-bowel obstruction, 8 hour follow-up film EXAM: PORTABLE ABDOMEN - 1 VIEW COMPARISON:  Film from earlier in the same day, CT from 01/28/2019 FINDINGS: Scattered large and small bowel gas is noted. A few persistently dilated loops of small bowel are noted up to 4 cm. No free air is seen. Contrast material has passed through to the colon consistent with a partial small bowel obstruction. No other focal abnormality is noted. IMPRESSION: Changes consistent with partial small bowel obstruction. Electronically Signed   By: Inez Catalina M.D.   On: 09/30/2019 22:25      Subjective: Patient says he is feeling better and he is getting out of here today.  He wants to go home.  He is already dressed and taking out his IV.   Discharge Exam: Vitals:   10/01/19 2125 10/02/19 0616  BP: 122/66 115/72  Pulse: 68 74  Resp: 18 20  Temp: 98.5 F (36.9 C) 98.6 F (37 C)  SpO2: 94% 94%   Vitals:   10/01/19 0536 10/01/19 1945 10/01/19 2125 10/02/19 0616  BP: 123/72  122/66 115/72  Pulse: 79  68 74  Resp: 18  18 20   Temp: 98.6 F (37 C)  98.5 F (36.9 C) 98.6 F (37 C)  TempSrc: Oral  Oral Oral  SpO2: 97% 92% 94% 94%  Weight:      Height:       General: Pt is alert, awake, not in acute distress Cardiovascular: RRR, S1/S2 +, no rubs, no gallops Respiratory: CTA bilaterally, no wheezing, no rhonchi Abdominal: Soft, NT, ND, bowel sounds + Extremities: no edema, no cyanosis   The results of significant diagnostics from this hospitalization (including imaging, microbiology, ancillary and laboratory) are listed below for  reference.     Microbiology: Recent Results (from the past 240 hour(s))  Blood culture (routine x 2)     Status: None (Preliminary result)   Collection Time: 09/28/19 11:43 AM   Specimen: Left Antecubital; Blood  Result Value Ref Range Status   Specimen Description LEFT ANTECUBITAL BOTTLES DRAWN AEROBIC ONLY  Final   Special Requests Blood Culture adequate volume  Final   Culture   Final    NO GROWTH 4 DAYS Performed at [facility], [address], [place], [place] [address]    Report Status PENDING  Incomplete  Blood culture (routine x 2)     Status: None (Preliminary result)   Collection Time: 09/28/19 11:44 AM   Specimen: Right Antecubital; Blood  Result Value Ref Range Status   Specimen Description   Final    RIGHT ANTECUBITAL BOTTLES DRAWN AEROBIC AND ANAEROBIC   Special Requests Blood Culture adequate volume  Final   Culture  Final    NO GROWTH 4 DAYS Performed at [facility], [address], [place], [place] [address]    Report Status PENDING  Incomplete  Respiratory Panel by RT PCR (Flu A&B, Covid) - Nasopharyngeal Swab     Status: None   Collection Time: 09/28/19  1:14 PM   Specimen: Nasopharyngeal Swab  Result Value Ref Range Status   SARS Coronavirus 2 by RT PCR NEGATIVE NEGATIVE Final    Comment: (NOTE) SARS-CoV-2 target nucleic acids are NOT DETECTED. The SARS-CoV-2 RNA is generally detectable in upper respiratoy specimens during the acute phase of infection. The lowest concentration of SARS-CoV-2 viral copies this assay can detect is 131 copies/mL. A negative result does not preclude SARS-Cov-2 infection and should not be used as the sole basis for treatment or other patient management decisions. A negative result may occur with  improper specimen collection/handling, submission of specimen other than nasopharyngeal swab, presence of viral mutation(s) within the areas targeted by this assay, and inadequate number of viral copies (<131 copies/mL). A negative  result must be combined with clinical observations, patient history, and epidemiological information. The expected result is Negative. Fact Sheet for Patients:  PinkCheek.be Fact Sheet for Healthcare Providers:  GravelBags.it This test is not yet ap proved or cleared by the [place] FDA and  has been authorized for detection and/or diagnosis of SARS-CoV-2 by FDA under an Emergency Use Authorization (EUA). This EUA will remain  in effect (meaning this test can be used) for the duration of the COVID-19 declaration under Section 564(b)(1) of the Act, 21 U.S.C. section 360bbb-3(b)(1), unless the authorization is terminated or revoked sooner.    Influenza A by PCR NEGATIVE NEGATIVE Final   Influenza B by PCR NEGATIVE NEGATIVE Final    Comment: (NOTE) The Xpert Xpress SARS-CoV-2/FLU/RSV assay is intended as an aid in  the diagnosis of influenza from Nasopharyngeal swab specimens and  should not be used as a sole basis for treatment. Nasal washings and  aspirates are unacceptable for Xpert Xpress SARS-CoV-2/FLU/RSV  testing. Fact Sheet for Patients: PinkCheek.be Fact Sheet for Healthcare Providers: GravelBags.it This test is not yet approved or cleared by the [place] FDA and  has been authorized for detection and/or diagnosis of SARS-CoV-2 by  FDA under an Emergency Use Authorization (EUA). This EUA will remain  in effect (meaning this test can be used) for the duration of the  Covid-19 declaration under Section 564(b)(1) of the Act, 21  U.S.C. section 360bbb-3(b)(1), unless the authorization is  terminated or revoked. Performed at [facility], [address] [place], [place] [address]   Culture, blood (Routine X 2) w Reflex to ID Panel     Status: None (Preliminary result)   Collection Time: 10/01/19  7:46 PM   Specimen: BLOOD LEFT ARM  Result Value Ref Range  Status   Specimen Description BLOOD LEFT ARM  Final   Special Requests   Final    BOTTLES DRAWN AEROBIC AND ANAEROBIC Blood Culture adequate volume   Culture   Final    NO GROWTH < 12 HOURS Performed at [facility], [address] [place], [place] [address]    Report Status PENDING  Incomplete  Culture, blood (Routine X 2) w Reflex to ID Panel     Status: None (Preliminary result)   Collection Time: 10/01/19  7:57 PM   Specimen: BLOOD LEFT HAND  Result Value Ref Range Status   Specimen Description BLOOD LEFT HAND  Final   Special Requests  Final    BOTTLES DRAWN AEROBIC AND ANAEROBIC Blood Culture adequate volume   Culture   Final    NO GROWTH < 12 HOURS Performed at [facility], [address] [place], [place] [address]    Report Status PENDING  Incomplete     Labs: BNP (last 3 results) No results for input(s): BNP in the last 8760 hours. Basic Metabolic Panel: Recent Labs  Lab 09/28/19 0733 09/29/19 0732 09/30/19 0451 10/01/19 0444 10/02/19 0448  NA 131* 136 136 138 136  K 3.9 3.9 3.5 3.4* 3.7  CL 98 107 105 107 105  CO2 20* 18* 19* 19* 20*  GLUCOSE 120* 90 109* 103* 124*  BUN 52* 21 12 9 9   CREATININE 1.89* 1.00 1.19 0.95 1.02  CALCIUM 8.4* 7.6* 8.2* 8.6* 8.7*  MG  --   --   --   --  1.6*  PHOS  --  2.1*  --   --   --    Liver Function Tests: Recent Labs  Lab 09/28/19 0733 09/29/19 0732 10/01/19 0444 10/02/19 0448  AST 15  --  19 18  ALT 19  --  20 23  ALKPHOS 45  --  45 44  BILITOT 0.9  --  0.5 0.7  PROT 7.5  --  6.9 7.0  ALBUMIN 3.5 2.9* 3.0* 2.9*   No results for input(s): LIPASE, AMYLASE in the last 168 hours. No results for input(s): AMMONIA in the last 168 hours. CBC: Recent Labs  Lab 09/28/19 0733 09/29/19 0732 09/30/19 0451 10/01/19 0444 10/01/19 1957  WBC 11.2* 13.0* 17.1* 19.6* 18.1*  NEUTROABS 5.7  --   --   --  11.8*  HGB 16.4 14.1 14.7 14.2 13.7  HCT 48.1 42.4 43.9 42.0 40.2  MCV 92.0 93.6 93.0 91.9 93.1  PLT 370 379 413*  422* 387   Cardiac Enzymes: No results for input(s): CKTOTAL, CKMB, CKMBINDEX, TROPONINI in the last 168 hours. BNP: Invalid input(s): POCBNP CBG: Recent Labs  Lab 09/30/19 2115 10/01/19 0800 10/01/19 1528 10/01/19 2127 10/02/19 0735  GLUCAP 120* 116* 119* 114* 123*   D-Dimer No results for input(s): DDIMER in the last 72 hours. Hgb A1c No results for input(s): HGBA1C in the last 72 hours. Lipid Profile No results for input(s): CHOL, HDL, LDLCALC, TRIG, CHOLHDL, LDLDIRECT in the last 72 hours. Thyroid function studies No results for input(s): TSH, T4TOTAL, T3FREE, THYROIDAB in the last 72 hours.  Invalid input(s): FREET3 Anemia work up No results for input(s): VITAMINB12, FOLATE, FERRITIN, TIBC, IRON, RETICCTPCT in the last 72 hours. Urinalysis    Component Value Date/Time   COLORURINE YELLOW 10/02/2019 0815   APPEARANCEUR CLEAR 10/02/2019 0815   LABSPEC 1.017 10/02/2019 0815   PHURINE 5.0 10/02/2019 0815   GLUCOSEU NEGATIVE 10/02/2019 0815   HGBUR SMALL (A) 10/02/2019 0815   BILIRUBINUR NEGATIVE 10/02/2019 0815   KETONESUR NEGATIVE 10/02/2019 0815   PROTEINUR NEGATIVE 10/02/2019 0815   NITRITE NEGATIVE 10/02/2019 0815   LEUKOCYTESUR NEGATIVE 10/02/2019 0815   Sepsis Labs Invalid input(s): PROCALCITONIN,  WBC,  LACTICIDVEN Microbiology Recent Results (from the past 240 hour(s))  Blood culture (routine x 2)     Status: None (Preliminary result)   Collection Time: 09/28/19 11:43 AM   Specimen: Left Antecubital; Blood  Result Value Ref Range Status   Specimen Description LEFT ANTECUBITAL BOTTLES DRAWN AEROBIC ONLY  Final   Special Requests Blood Culture adequate volume  Final   Culture   Final    NO GROWTH  4 DAYS Performed at [facility], [address] [place], [place] [address]    Report Status PENDING  Incomplete  Blood culture (routine x 2)     Status: None (Preliminary result)   Collection Time: 09/28/19 11:44 AM   Specimen: Right Antecubital; Blood   Result Value Ref Range Status   Specimen Description   Final    RIGHT ANTECUBITAL BOTTLES DRAWN AEROBIC AND ANAEROBIC   Special Requests Blood Culture adequate volume  Final   Culture   Final    NO GROWTH 4 DAYS Performed at [facility], [address], [place], [place] [address]    Report Status PENDING  Incomplete  Respiratory Panel by RT PCR (Flu A&B, Covid) - Nasopharyngeal Swab     Status: None   Collection Time: 09/28/19  1:14 PM   Specimen: Nasopharyngeal Swab  Result Value Ref Range Status   SARS Coronavirus 2 by RT PCR NEGATIVE NEGATIVE Final    Comment: (NOTE) SARS-CoV-2 target nucleic acids are NOT DETECTED. The SARS-CoV-2 RNA is generally detectable in upper respiratoy specimens during the acute phase of infection. The lowest concentration of SARS-CoV-2 viral copies this assay can detect is 131 copies/mL. A negative result does not preclude SARS-Cov-2 infection and should not be used as the sole basis for treatment or other patient management decisions. A negative result may occur with  improper specimen collection/handling, submission of specimen other than nasopharyngeal swab, presence of viral mutation(s) within the areas targeted by this assay, and inadequate number of viral copies (<131 copies/mL). A negative result must be combined with clinical observations, patient history, and epidemiological information. The expected result is Negative. Fact Sheet for Patients:  PinkCheek.be Fact Sheet for Healthcare Providers:  GravelBags.it This test is not yet ap proved or cleared by the [place] FDA and  has been authorized for detection and/or diagnosis of SARS-CoV-2 by FDA under an Emergency Use Authorization (EUA). This EUA will remain  in effect (meaning this test can be used) for the duration of the COVID-19 declaration under Section 564(b)(1) of the Act, 21 U.S.C. section 360bbb-3(b)(1), unless the  authorization is terminated or revoked sooner.    Influenza A by PCR NEGATIVE NEGATIVE Final   Influenza B by PCR NEGATIVE NEGATIVE Final    Comment: (NOTE) The Xpert Xpress SARS-CoV-2/FLU/RSV assay is intended as an aid in  the diagnosis of influenza from Nasopharyngeal swab specimens and  should not be used as a sole basis for treatment. Nasal washings and  aspirates are unacceptable for Xpert Xpress SARS-CoV-2/FLU/RSV  testing. Fact Sheet for Patients: PinkCheek.be Fact Sheet for Healthcare Providers: GravelBags.it This test is not yet approved or cleared by the [place] FDA and  has been authorized for detection and/or diagnosis of SARS-CoV-2 by  FDA under an Emergency Use Authorization (EUA). This EUA will remain  in effect (meaning this test can be used) for the duration of the  Covid-19 declaration under Section 564(b)(1) of the Act, 21  U.S.C. section 360bbb-3(b)(1), unless the authorization is  terminated or revoked. Performed at [facility], [address], [place], [place] [address]   Culture, blood (Routine X 2) w Reflex to ID Panel     Status: None (Preliminary result)   Collection Time: 10/01/19  7:46 PM   Specimen: BLOOD LEFT ARM  Result Value Ref Range Status   Specimen Description BLOOD LEFT ARM  Final   Special Requests   Final    BOTTLES DRAWN AEROBIC AND ANAEROBIC Blood Culture adequate volume  Culture   Final    NO GROWTH < 12 HOURS Performed at [facility], [address] [place], [place] [address]    Report Status PENDING  Incomplete  Culture, blood (Routine X 2) w Reflex to ID Panel     Status: None (Preliminary result)   Collection Time: 10/01/19  7:57 PM   Specimen: BLOOD LEFT HAND  Result Value Ref Range Status   Specimen Description BLOOD LEFT HAND  Final   Special Requests   Final    BOTTLES DRAWN AEROBIC AND ANAEROBIC Blood Culture adequate volume   Culture   Final    NO GROWTH <  12 HOURS Performed at [facility], [address], [place],  [address]    Report Status PENDING  Incomplete    Time coordinating discharge: 34 minutes   SIGNED:  Irwin Brakeman, MD  [facility] 10/02/2019, 9:32 AM How to contact the [facility] Attending or Consulting provider [place] or covering provider during after hours [place], for this patient?  1. Check the care team in [facility] and look for a) attending/consulting [facility] provider listed and b) the [facility] team listed 2. Log into [email or web address] and use [place]'s universal password to access. If you do not have the password, please contact the hospital operator. 3. Locate the [facility] provider you are looking for under [facility] and page to a number that you can be directly reached. 4. If you still have difficulty reaching the provider, please page the [facility] (Director on Call) for the Hospitalists listed on amion for assistance.

## 2019-10-02 NOTE — Progress Notes (Signed)
  Subjective: Patient tolerating heart healthy diet well.  Having solid bowel movements now.  No watery diarrhea noted.  No abdominal pain noted.  Objective: Vital signs in last 24 hours: Temp:  [98.4 F (36.9 C)-98.6 F (37 C)] 98.4 F (36.9 C) (04/28 0950) Pulse Rate:  [68-74] 72 (04/28 0950) Resp:  [18-20] 18 (04/28 0950) BP: (115-122)/(66-72) 116/67 (04/28 0950) SpO2:  [92 %-96 %] 96 % (04/28 0950) Last BM Date: 10/01/19  Intake/Output from previous day: 04/27 0701 - 04/28 0700 In: 720 [P.O.:720] Out: -  Intake/Output this shift: Total I/O In: 240 [P.O.:240] Out: -   General appearance: alert, cooperative and no distress GI: soft, non-tender; bowel sounds normal; no masses,  no organomegaly  Lab Results:  Recent Labs    10/01/19 0444 10/01/19 1957  WBC 19.6* 18.1*  HGB 14.2 13.7  HCT 42.0 40.2  PLT 422* 387   BMET Recent Labs    10/01/19 0444 10/02/19 0448  NA 138 136  K 3.4* 3.7  CL 107 105  CO2 19* 20*  GLUCOSE 103* 124*  BUN 9 9  CREATININE 0.95 1.02  CALCIUM 8.6* 8.7*   PT/INR No results for input(s): LABPROT, INR in the last 72 hours.  Studies/Results: DG Abd Portable 1V-Small Bowel Obstruction Protocol-24 hr delay  Result Date: 10/01/2019 CLINICAL DATA:  Small bowel obstruction EXAM: PORTABLE ABDOMEN - 1 VIEW COMPARISON:  Abdominal radiograph September 30, 2019; CT abdomen and pelvis September 28, 2019. FINDINGS: There has been interval resolution of small bowel dilatation compared to 1 day prior. On the current examination, there is moderate stool in the colon. There is no bowel dilatation or air-fluid level to suggest bowel obstruction. No free air. There are apparent phleboliths in the pelvis. There are surgical clips in the right upper ABS IMPRESSION: There is no longer appreciable bowel dilatation. Appearance is felt to be indicative of resolution of bowel obstruction. No evident free air. Electronically Signed   By: Lowella Grip III M.D.   On:  10/01/2019 14:29   DG Abd Portable 1V-Small Bowel Obstruction Protocol-initial, 8 hr delay  Result Date: 09/30/2019 CLINICAL DATA:  Small-bowel obstruction, 8 hour follow-up film EXAM: PORTABLE ABDOMEN - 1 VIEW COMPARISON:  Film from earlier in the same day, CT from 01/28/2019 FINDINGS: Scattered large and small bowel gas is noted. A few persistently dilated loops of small bowel are noted up to 4 cm. No free air is seen. Contrast material has passed through to the colon consistent with a partial small bowel obstruction. No other focal abnormality is noted. IMPRESSION: Changes consistent with partial small bowel obstruction. Electronically Signed   By: Inez Catalina M.D.   On: 09/30/2019 22:25    Anti-infectives: Anti-infectives (From admission, onward)   None      Assessment/Plan: Impression: Partial small bowel obstruction versus bowel dysmotility resolved.  No need for surgical intervention. Plan: Okay for discharge from surgery standpoint.  Will sign off.  LOS: 4 days    Aviva Signs 10/02/2019

## 2019-10-03 LAB — CULTURE, BLOOD (ROUTINE X 2)
Culture: NO GROWTH
Culture: NO GROWTH
Special Requests: ADEQUATE
Special Requests: ADEQUATE

## 2019-10-06 LAB — CULTURE, BLOOD (ROUTINE X 2)
Culture: NO GROWTH
Culture: NO GROWTH
Special Requests: ADEQUATE
Special Requests: ADEQUATE

## 2019-10-07 DIAGNOSIS — G4700 Insomnia, unspecified: Secondary | ICD-10-CM | POA: Diagnosis not present

## 2019-10-07 DIAGNOSIS — E782 Mixed hyperlipidemia: Secondary | ICD-10-CM | POA: Diagnosis not present

## 2019-10-07 DIAGNOSIS — I10 Essential (primary) hypertension: Secondary | ICD-10-CM | POA: Diagnosis not present

## 2019-10-07 DIAGNOSIS — R7301 Impaired fasting glucose: Secondary | ICD-10-CM | POA: Diagnosis not present

## 2019-10-07 DIAGNOSIS — E1169 Type 2 diabetes mellitus with other specified complication: Secondary | ICD-10-CM | POA: Diagnosis not present

## 2019-10-10 DIAGNOSIS — E1169 Type 2 diabetes mellitus with other specified complication: Secondary | ICD-10-CM | POA: Diagnosis not present

## 2019-10-10 DIAGNOSIS — I10 Essential (primary) hypertension: Secondary | ICD-10-CM | POA: Diagnosis not present

## 2019-10-10 DIAGNOSIS — E782 Mixed hyperlipidemia: Secondary | ICD-10-CM | POA: Diagnosis not present

## 2019-10-10 DIAGNOSIS — G4700 Insomnia, unspecified: Secondary | ICD-10-CM | POA: Diagnosis not present

## 2019-11-22 ENCOUNTER — Other Ambulatory Visit (HOSPITAL_COMMUNITY): Payer: Self-pay | Admitting: Internal Medicine

## 2019-11-22 DIAGNOSIS — M542 Cervicalgia: Secondary | ICD-10-CM

## 2019-11-25 ENCOUNTER — Ambulatory Visit (HOSPITAL_COMMUNITY)
Admission: RE | Admit: 2019-11-25 | Discharge: 2019-11-25 | Disposition: A | Discharge status: Home / Self Care | Payer: Medicare Other | Source: Ambulatory Visit | Attending: Internal Medicine | Admitting: Internal Medicine

## 2019-11-25 ENCOUNTER — Other Ambulatory Visit: Payer: Self-pay

## 2019-11-25 DIAGNOSIS — M542 Cervicalgia: Secondary | ICD-10-CM | POA: Diagnosis not present

## 2019-12-17 DIAGNOSIS — E1165 Type 2 diabetes mellitus with hyperglycemia: Secondary | ICD-10-CM | POA: Diagnosis not present

## 2019-12-17 DIAGNOSIS — I10 Essential (primary) hypertension: Secondary | ICD-10-CM | POA: Diagnosis not present

## 2019-12-17 DIAGNOSIS — E782 Mixed hyperlipidemia: Secondary | ICD-10-CM | POA: Diagnosis not present

## 2019-12-17 DIAGNOSIS — M542 Cervicalgia: Secondary | ICD-10-CM | POA: Diagnosis not present

## 2019-12-17 DIAGNOSIS — G4700 Insomnia, unspecified: Secondary | ICD-10-CM | POA: Diagnosis not present

## 2019-12-17 DIAGNOSIS — E1169 Type 2 diabetes mellitus with other specified complication: Secondary | ICD-10-CM | POA: Diagnosis not present

## 2019-12-27 DIAGNOSIS — M542 Cervicalgia: Secondary | ICD-10-CM | POA: Diagnosis not present

## 2020-01-13 DIAGNOSIS — M542 Cervicalgia: Secondary | ICD-10-CM | POA: Diagnosis not present

## 2020-01-13 DIAGNOSIS — M47812 Spondylosis without myelopathy or radiculopathy, cervical region: Secondary | ICD-10-CM | POA: Diagnosis not present

## 2020-01-20 DIAGNOSIS — G4700 Insomnia, unspecified: Secondary | ICD-10-CM | POA: Diagnosis not present

## 2020-01-20 DIAGNOSIS — Z0000 Encounter for general adult medical examination without abnormal findings: Secondary | ICD-10-CM | POA: Diagnosis not present

## 2020-01-20 DIAGNOSIS — E1169 Type 2 diabetes mellitus with other specified complication: Secondary | ICD-10-CM | POA: Diagnosis not present

## 2020-01-23 DIAGNOSIS — Z0001 Encounter for general adult medical examination with abnormal findings: Secondary | ICD-10-CM | POA: Diagnosis not present

## 2020-01-23 DIAGNOSIS — E782 Mixed hyperlipidemia: Secondary | ICD-10-CM | POA: Diagnosis not present

## 2020-01-23 DIAGNOSIS — E1169 Type 2 diabetes mellitus with other specified complication: Secondary | ICD-10-CM | POA: Diagnosis not present

## 2020-01-23 DIAGNOSIS — G4700 Insomnia, unspecified: Secondary | ICD-10-CM | POA: Diagnosis not present

## 2020-01-23 DIAGNOSIS — E7849 Other hyperlipidemia: Secondary | ICD-10-CM | POA: Diagnosis not present

## 2020-01-23 DIAGNOSIS — I10 Essential (primary) hypertension: Secondary | ICD-10-CM | POA: Diagnosis not present

## 2020-01-24 ENCOUNTER — Telehealth (HOSPITAL_COMMUNITY): Payer: Self-pay | Admitting: Physical Therapy

## 2020-01-24 NOTE — Telephone Encounter (Signed)
pt no longer wants to do therapy, this referral will be closed

## 2020-02-03 ENCOUNTER — Encounter (HOSPITAL_COMMUNITY): Payer: Self-pay

## 2020-02-03 ENCOUNTER — Ambulatory Visit (HOSPITAL_COMMUNITY): Payer: Medicare Other | Admitting: Physical Therapy

## 2020-03-26 ENCOUNTER — Ambulatory Visit: Payer: Medicare Other | Attending: Internal Medicine

## 2020-03-26 DIAGNOSIS — Z23 Encounter for immunization: Secondary | ICD-10-CM

## 2020-03-26 NOTE — Progress Notes (Signed)
   Covid-19 Vaccination Clinic  Name:  NOHLAN BURDIN    MRN: [id number] DOB: Jun 02, 1946  03/26/2020  Mr. Sabino was observed post Covid-19 immunization for 15 minutes without incident. He was provided with Vaccine Information Sheet and instruction to access the V-Safe system.   Mr. Gunkel was instructed to call 911 with any severe reactions post vaccine: Marland Kitchen Difficulty breathing  . Swelling of face and throat  . A fast heartbeat  . A bad rash all over body  . Dizziness and weakness

## 2020-04-29 DIAGNOSIS — E1169 Type 2 diabetes mellitus with other specified complication: Secondary | ICD-10-CM | POA: Diagnosis not present

## 2020-04-29 DIAGNOSIS — Z0000 Encounter for general adult medical examination without abnormal findings: Secondary | ICD-10-CM | POA: Diagnosis not present

## 2020-04-29 DIAGNOSIS — G4700 Insomnia, unspecified: Secondary | ICD-10-CM | POA: Diagnosis not present

## 2020-05-05 DIAGNOSIS — E782 Mixed hyperlipidemia: Secondary | ICD-10-CM | POA: Diagnosis not present

## 2020-05-05 DIAGNOSIS — I10 Essential (primary) hypertension: Secondary | ICD-10-CM | POA: Diagnosis not present

## 2020-05-05 DIAGNOSIS — G4700 Insomnia, unspecified: Secondary | ICD-10-CM | POA: Diagnosis not present

## 2020-05-05 DIAGNOSIS — E1169 Type 2 diabetes mellitus with other specified complication: Secondary | ICD-10-CM | POA: Diagnosis not present

## 2020-05-27 ENCOUNTER — Emergency Department (HOSPITAL_COMMUNITY): Payer: Medicare Other

## 2020-05-27 ENCOUNTER — Emergency Department (HOSPITAL_COMMUNITY)
Admission: EM | Admit: 2020-05-27 | Discharge: 2020-05-27 | Disposition: A | Discharge status: Home / Self Care | Payer: Medicare Other | Attending: Emergency Medicine | Admitting: Emergency Medicine

## 2020-05-27 ENCOUNTER — Other Ambulatory Visit: Payer: Self-pay

## 2020-05-27 DIAGNOSIS — I10 Essential (primary) hypertension: Secondary | ICD-10-CM | POA: Diagnosis not present

## 2020-05-27 DIAGNOSIS — J449 Chronic obstructive pulmonary disease, unspecified: Secondary | ICD-10-CM | POA: Diagnosis not present

## 2020-05-27 DIAGNOSIS — Z79899 Other long term (current) drug therapy: Secondary | ICD-10-CM | POA: Insufficient documentation

## 2020-05-27 DIAGNOSIS — E119 Type 2 diabetes mellitus without complications: Secondary | ICD-10-CM | POA: Diagnosis not present

## 2020-05-27 DIAGNOSIS — R911 Solitary pulmonary nodule: Secondary | ICD-10-CM | POA: Diagnosis not present

## 2020-05-27 DIAGNOSIS — Z7984 Long term (current) use of oral hypoglycemic drugs: Secondary | ICD-10-CM | POA: Diagnosis not present

## 2020-05-27 DIAGNOSIS — M25522 Pain in left elbow: Secondary | ICD-10-CM | POA: Diagnosis not present

## 2020-05-27 DIAGNOSIS — M25512 Pain in left shoulder: Secondary | ICD-10-CM | POA: Diagnosis not present

## 2020-05-27 DIAGNOSIS — F17210 Nicotine dependence, cigarettes, uncomplicated: Secondary | ICD-10-CM | POA: Insufficient documentation

## 2020-05-27 DIAGNOSIS — R079 Chest pain, unspecified: Secondary | ICD-10-CM | POA: Diagnosis not present

## 2020-05-27 DIAGNOSIS — M79602 Pain in left arm: Secondary | ICD-10-CM | POA: Diagnosis not present

## 2020-05-27 DIAGNOSIS — Z7982 Long term (current) use of aspirin: Secondary | ICD-10-CM | POA: Insufficient documentation

## 2020-05-27 LAB — COMPREHENSIVE METABOLIC PANEL
ALT: 31 U/L (ref 0–44)
AST: 27 U/L (ref 15–41)
Albumin: 4.3 g/dL (ref 3.5–5.0)
Alkaline Phosphatase: 39 U/L (ref 38–126)
Anion gap: 9 (ref 5–15)
BUN: 19 mg/dL (ref 8–23)
CO2: 25 mmol/L (ref 22–32)
Calcium: 9.4 mg/dL (ref 8.9–10.3)
Chloride: 101 mmol/L (ref 98–111)
Creatinine, Ser: 1.02 mg/dL (ref 0.61–1.24)
GFR, Estimated: >60 mL/min (ref >60)
Glucose, Bld: 116 mg/dL — ABNORMAL HIGH (ref 70–99)
Potassium: 4.4 mmol/L (ref 3.5–5.1)
Sodium: 135 mmol/L (ref 135–145)
Total Bilirubin: 0.7 mg/dL (ref 0.3–1.2)
Total Protein: 7.5 g/dL (ref 6.5–8.1)

## 2020-05-27 LAB — TROPONIN I (HIGH SENSITIVITY)
Troponin I (High Sensitivity): 8 ng/L (ref <18)
Troponin I (High Sensitivity): 8 ng/L (ref <18)

## 2020-05-27 LAB — CBC WITH DIFFERENTIAL/PLATELET
Abs Immature Granulocytes: 0.02 10*3/uL (ref 0.00–0.07)
Basophils Absolute: 0.1 10*3/uL (ref 0.0–0.1)
Basophils Relative: 1 %
Eosinophils Absolute: 0.2 10*3/uL (ref 0.0–0.5)
Eosinophils Relative: 2 %
HCT: 46.3 % (ref 39.0–52.0)
Hemoglobin: 15.4 g/dL (ref 13.0–17.0)
Immature Granulocytes: 0 %
Lymphocytes Relative: 31 %
Lymphs Abs: 3.1 10*3/uL (ref 0.7–4.0)
MCH: 32.2 pg (ref 26.0–34.0)
MCHC: 33.3 g/dL (ref 30.0–36.0)
MCV: 96.9 fL (ref 80.0–100.0)
Monocytes Absolute: 0.9 10*3/uL (ref 0.1–1.0)
Monocytes Relative: 9 %
Neutro Abs: 5.8 10*3/uL (ref 1.7–7.7)
Neutrophils Relative %: 57 %
Platelets: 345 10*3/uL (ref 150–400)
RBC: 4.78 MIL/uL (ref 4.22–5.81)
RDW: 13.7 % (ref 11.5–15.5)
WBC: 10.1 10*3/uL (ref 4.0–10.5)
nRBC: 0 % (ref 0.0–0.2)

## 2020-05-27 MED ORDER — TRAMADOL HCL 50 MG PO TABS: 50.0000 mg | ORAL_TABLET | Freq: Four times a day (QID) | ORAL | 0 refills | Status: AC | PRN

## 2020-05-27 MED ORDER — KETOROLAC TROMETHAMINE 30 MG/ML IJ SOLN
15.0000 mg | Freq: Once | INTRAMUSCULAR | Status: AC
  Administered 2020-05-27: 15 mg via INTRAVENOUS
  Filled 2020-05-27: qty 1

## 2020-05-27 NOTE — Discharge Instructions (Addendum)
Follow-up with your family doctor next week for recheck. At least take your pain medicine at bedtime if you cannot sleep but it is helping during the day you can continue with that

## 2020-05-27 NOTE — ED Triage Notes (Addendum)
Pt reports left arm soreness that is worse with movement since this am at 430. Pt reports intermittent left arm tingling. Pt reports cervical narrowing in 3 vertebrae and reports feels like similar pain in left arm that is usually in right arm. Pt reports dizziness at baseline and denies any worsening today. Pt ambulated with steady gait, denies chest pain, speech clear. nad noted.  Pt reports knows needs surgery but reports doesn't want narcotics for pain management.

## 2020-05-27 NOTE — ED Provider Notes (Signed)
[facility] EMERGENCY DEPARTMENT Provider Note   CSN: [id number] Arrival date & time: 05/27/20  0820     History Chief Complaint  Patient presents with  . Shoulder Pain    ERBY CANSECO is a 74 y.o. male.  Patient complains of left shoulder left arm pain worse with movement and palpation he was concerned about his heart giving him problems with this.  The history is provided by the patient and medical records. No language interpreter was used.  Shoulder Pain Location:  Shoulder Shoulder location:  L shoulder Injury: no   Pain details:    Quality:  Aching   Radiates to:  L elbow   Severity:  Moderate   Onset quality:  Sudden   Timing:  Intermittent Associated symptoms: no back pain and no fatigue        Past Medical History:  Diagnosis Date  . Adenomatous polyp 2002   tcs by [name]  . Diabetes mellitus without complication ([place])   . Diverticula of colon 2002   L side  . GERD (gastroesophageal reflux disease)   . Hemorrhoids 2002   internal and external  . Hiatal hernia 2002   moderate  . Hyperlipidemia   . Hypertension   . Mini stroke ([place]) 1998   no deficits  . Sleep apnea    does not use CPAP. PCP aware.    Patient Active Problem List   Diagnosis Date Noted  . Leukocytosis 10/01/2019  . Diarrhea in adult patient 10/01/2019  . SBO (small bowel obstruction) ([place]) 09/28/2019  . AKI (acute kidney injury) ([place]) 09/28/2019  . Dehydration 09/28/2019  . Emesis, persistent 09/28/2019  . Memory change 11/01/2012  . OSA (obstructive sleep apnea) 05/02/2012  . Hx of adenomatous colonic polyps 12/02/2011  . Routine general medical examination at a health care facility 11/04/2011  . Decreased visual acuity 11/04/2011  . Hyperlipidemia 11/04/2011  . Tobacco use 09/21/2011  . History of TIAs 09/20/2011  . COPD (chronic obstructive pulmonary disease) ([place]) 09/20/2011  . Insomnia 09/20/2011  . Colon polyp 09/20/2011    Past Surgical History:  Procedure  Laterality Date  . CATARACT EXTRACTION W/PHACO Left 12/31/2015   Procedure: CATARACT EXTRACTION PHACO AND INTRAOCULAR LENS PLACEMENT LEFT EYE; CDE: 17.28;  Surgeon: [name], MD;  Location: [facility] ORS;  Service: Ophthalmology;  Laterality: Left;  . CHOLECYSTECTOMY  1990   [name]  . COLONOSCOPY  01/23/01   [name]- small internal and external hemorrhoids, L sided mild diverticula. adenomatous polyp removed from rectum.  . COLONOSCOPY  12/26/2011   Procedure: COLONOSCOPY;  Surgeon: [name], MD;  Location: [facility] ENDO SUITE;  Service: Endoscopy;  Laterality: N/A;  8:30  . ESOPHAGOGASTRODUODENOSCOPY  01/23/01   Dr. [name] hiatal hernia, mild to moderate gastritis  . EXPLORATORY LAPAROTOMY     with lysis of adhesions  . 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 Hyperlipidemia Mother   . Heart disease Mother   . Hypertension Mother   . Stroke Mother   . Heart disease Father        MI  . Colon cancer Neg Hx     Social History   Tobacco Use  . Smoking status: Current Every Day Smoker    Packs/day: 0.50    Years: 50.00    Pack years: 25.00    Types: Cigarettes  . Smokeless tobacco: Never Used  . Tobacco comment:  smokes about 4 cigarettes daily  Substance Use Topics  . Alcohol use: Yes    Comment: 2 beers daily x 40 yrs-rarely more  . Drug use: No    Home Medications Prior to Admission medications   Medication Sig Start Date End Date Taking? Authorizing Provider  aspirin 325 MG tablet Take 325 mg by mouth daily.     [provider]  atorvastatin (LIPITOR) 20 MG tablet TAKE (1) TABLET BY MOUTH ONCE DAILY. Patient taking differently: Take 20 mg by mouth daily.  10/14/13   Liberty, Modena Nunnery, MD  calcium carbonate (TUMS - DOSED IN MG ELEMENTAL CALCIUM) 500 MG chewable tablet Chew 2 tablets (400 mg of elemental calcium total) by mouth 2 (two) times daily. 10/02/19   Johnson, Clanford L, MD   diphenoxylate-atropine (LOMOTIL) 2.5-0.025 MG tablet Take 1 tablet by mouth in the morning and at bedtime.    [provider]  JANUVIA 100 MG tablet Take 100 mg by mouth daily. 08/30/19   [provider]  losartan (COZAAR) 50 MG tablet Take 50 mg by mouth daily. 09/17/19   [provider]  metFORMIN (GLUCOPHAGE) 500 MG tablet Take 500 mg by mouth 2 (two) times daily. 09/16/19   [provider]  omeprazole (PRILOSEC) 20 MG capsule TAKE 1 CAPSULE BY MOUTH ONCE A DAY. 08/08/13   [name], MD  ondansetron (ZOFRAN) 4 MG tablet Take 4 mg by mouth every 6 (six) hours as needed for nausea or vomiting.    [provider]  traMADol (ULTRAM) 50 MG tablet Take 1 tablet (50 mg total) by mouth every 6 (six) hours as needed. 05/27/20   Milton Ferguson, MD    Allergies    Patient has no known allergies.  Review of Systems   Review of Systems  Constitutional: Negative for appetite change and fatigue.  HENT: Negative for congestion, ear discharge and sinus pressure.   Eyes: Negative for discharge.  Respiratory: Negative for cough.   Cardiovascular: Negative for chest pain.  Gastrointestinal: Negative for abdominal pain and diarrhea.  Genitourinary: Negative for frequency and hematuria.  Musculoskeletal: Negative for back pain.       Left arm and shoulder discomfort  Skin: Negative for rash.  Neurological: Negative for seizures and headaches.  Psychiatric/Behavioral: Negative for hallucinations.    Physical Exam Updated Vital Signs BP 135/73   Pulse (!) 57   Temp 98 F (36.7 C) (Oral)   Resp 20   Ht 6\' 2"  (1.88 m)   Wt 95.3 kg   SpO2 97%   BMI 26.96 kg/m   Physical Exam Vitals and nursing note reviewed.  Constitutional:      Appearance: He is well-developed.  HENT:     Head: Normocephalic.     Nose: Nose normal.  Eyes:     General: No scleral icterus.    Extraocular Movements: EOM normal.     Conjunctiva/sclera: Conjunctivae normal.   Neck:     Thyroid: No thyromegaly.  Cardiovascular:     Rate and Rhythm: Normal rate and regular rhythm.     Heart sounds: No murmur heard. No friction rub. No gallop.   Pulmonary:     Breath sounds: No stridor. No wheezing or rales.  Chest:     Chest wall: No tenderness.  Abdominal:     General: There is no distension.     Tenderness: There is no abdominal tenderness. There is no rebound.  Musculoskeletal:        General: No  edema. Normal range of motion.     Cervical back: Neck supple.     Comments: Tender left shoulder and humerus  Lymphadenopathy:     Cervical: No cervical adenopathy.  Skin:    Findings: No erythema or rash.  Neurological:     Mental Status: He is alert and oriented to person, place, and time.     Motor: No abnormal muscle tone.     Coordination: Coordination normal.  Psychiatric:        Mood and Affect: Mood and affect normal.        Behavior: Behavior normal.     ED Results / Procedures / Treatments   Labs (all labs ordered are listed, but only abnormal results are displayed) Labs Reviewed  COMPREHENSIVE METABOLIC PANEL - Abnormal; Notable for the following components:      Result Value   Glucose, Bld 116 (*)    All other components within normal limits  CBC WITH DIFFERENTIAL/PLATELET  TROPONIN I (HIGH SENSITIVITY)  TROPONIN I (HIGH SENSITIVITY)    EKG EKG Interpretation  Date/Time:  Wednesday May 27 2020 08:38:29 EST Ventricular Rate:  73 PR Interval:    QRS Duration: 78 QT Interval:  357 QTC Calculation: 394 R Axis:   58 Text Interpretation: Sinus rhythm Atrial premature complexes Borderline repolarization abnormality Baseline wander in lead(s) I III aVL Confirmed by Milton Ferguson [phone]) on 05/27/2020 12:37:48 PM   Radiology DG Chest 2 View  Result Date: 05/27/2020 CLINICAL DATA:  Chest pain. EXAM: CHEST - 2 VIEW COMPARISON:  Radiographs January 04, 2019. FINDINGS: The heart size and mediastinal contours are within normal  limits. No consolidation. Please see prior chest CT for characterization of known pulmonary nodules. No visible pleural effusions or pneumothorax. Biapical pleuroparenchymal scarring. No acute osseous abnormality. Cholecystectomy clips. IMPRESSION: No active cardiopulmonary disease. Electronically Signed   By: Margaretha Sheffield MD   On: 05/27/2020 09:30   DG Shoulder Left  Result Date: 05/27/2020 CLINICAL DATA:  Pain EXAM: LEFT SHOULDER - 2+ VIEW COMPARISON:  None. FINDINGS: Frontal and Y scapular images were obtained. No fracture or dislocation. No appreciable joint space narrowing or erosion. Visualized left lung clear. IMPRESSION: No fracture or dislocation.  No appreciable arthropathy. Electronically Signed   By: Lowella Grip III M.D.   On: 05/27/2020 09:28    Procedures Procedures (including critical care time)  Medications Ordered in ED Medications  ketorolac (TORADOL) 30 MG/ML injection 15 mg (15 mg Intravenous Given 05/27/20 1607)    ED Course  I have reviewed the triage vital signs and the nursing notes.  Pertinent labs & imaging results that were available during my care of the patient were reviewed by me and considered in my medical decision making (see chart for details).    MDM Rules/Calculators/A&P                          Patient with a negative cardiac work-up. Suspect pain is more musculoskeletal. Patient will be given Ultram will follow up with his PCP Final Clinical Impression(s) / ED Diagnoses Final diagnoses:  Left shoulder pain, unspecified chronicity    Rx / DC Orders ED Discharge Orders         Ordered    traMADol (ULTRAM) 50 MG tablet  Every 6 hours PRN        05/27/20 1239           Milton Ferguson, MD 05/27/20 1244

## 2020-06-04 DIAGNOSIS — M542 Cervicalgia: Secondary | ICD-10-CM | POA: Diagnosis not present

## 2020-06-04 DIAGNOSIS — E1169 Type 2 diabetes mellitus with other specified complication: Secondary | ICD-10-CM | POA: Diagnosis not present

## 2020-06-04 DIAGNOSIS — M5030 Other cervical disc degeneration, unspecified cervical region: Secondary | ICD-10-CM | POA: Diagnosis not present

## 2020-06-05 DIAGNOSIS — E782 Mixed hyperlipidemia: Secondary | ICD-10-CM | POA: Diagnosis not present

## 2020-06-05 DIAGNOSIS — I10 Essential (primary) hypertension: Secondary | ICD-10-CM | POA: Diagnosis not present

## 2020-06-05 DIAGNOSIS — E1169 Type 2 diabetes mellitus with other specified complication: Secondary | ICD-10-CM | POA: Diagnosis not present

## 2020-07-16 DIAGNOSIS — E1143 Type 2 diabetes mellitus with diabetic autonomic (poly)neuropathy: Secondary | ICD-10-CM | POA: Diagnosis not present

## 2020-07-16 DIAGNOSIS — M542 Cervicalgia: Secondary | ICD-10-CM | POA: Diagnosis not present

## 2020-08-04 DIAGNOSIS — L110 Acquired keratosis follicularis: Secondary | ICD-10-CM | POA: Diagnosis not present

## 2020-08-04 DIAGNOSIS — M79674 Pain in right toe(s): Secondary | ICD-10-CM | POA: Diagnosis not present

## 2020-08-04 DIAGNOSIS — I739 Peripheral vascular disease, unspecified: Secondary | ICD-10-CM | POA: Diagnosis not present

## 2020-08-04 DIAGNOSIS — M79672 Pain in left foot: Secondary | ICD-10-CM | POA: Diagnosis not present

## 2020-08-04 DIAGNOSIS — M79675 Pain in left toe(s): Secondary | ICD-10-CM | POA: Diagnosis not present

## 2020-08-04 DIAGNOSIS — M79671 Pain in right foot: Secondary | ICD-10-CM | POA: Diagnosis not present

## 2020-08-04 DIAGNOSIS — E1140 Type 2 diabetes mellitus with diabetic neuropathy, unspecified: Secondary | ICD-10-CM | POA: Diagnosis not present

## 2020-08-07 DIAGNOSIS — G4700 Insomnia, unspecified: Secondary | ICD-10-CM | POA: Diagnosis not present

## 2020-08-07 DIAGNOSIS — Z0000 Encounter for general adult medical examination without abnormal findings: Secondary | ICD-10-CM | POA: Diagnosis not present

## 2020-08-07 DIAGNOSIS — E1169 Type 2 diabetes mellitus with other specified complication: Secondary | ICD-10-CM | POA: Diagnosis not present

## 2020-08-12 DIAGNOSIS — R945 Abnormal results of liver function studies: Secondary | ICD-10-CM | POA: Diagnosis not present

## 2020-08-12 DIAGNOSIS — E1169 Type 2 diabetes mellitus with other specified complication: Secondary | ICD-10-CM | POA: Diagnosis not present

## 2020-08-12 DIAGNOSIS — F17211 Nicotine dependence, cigarettes, in remission: Secondary | ICD-10-CM | POA: Diagnosis not present

## 2020-08-12 DIAGNOSIS — E782 Mixed hyperlipidemia: Secondary | ICD-10-CM | POA: Diagnosis not present

## 2020-08-12 DIAGNOSIS — I10 Essential (primary) hypertension: Secondary | ICD-10-CM | POA: Diagnosis not present

## 2020-08-12 DIAGNOSIS — R944 Abnormal results of kidney function studies: Secondary | ICD-10-CM | POA: Diagnosis not present

## 2020-08-12 DIAGNOSIS — M542 Cervicalgia: Secondary | ICD-10-CM | POA: Diagnosis not present

## 2020-08-12 DIAGNOSIS — D72829 Elevated white blood cell count, unspecified: Secondary | ICD-10-CM | POA: Diagnosis not present

## 2020-08-12 DIAGNOSIS — G4700 Insomnia, unspecified: Secondary | ICD-10-CM | POA: Diagnosis not present

## 2020-08-20 DIAGNOSIS — M79672 Pain in left foot: Secondary | ICD-10-CM | POA: Diagnosis not present

## 2020-08-20 DIAGNOSIS — I739 Peripheral vascular disease, unspecified: Secondary | ICD-10-CM | POA: Diagnosis not present

## 2020-08-20 DIAGNOSIS — E1140 Type 2 diabetes mellitus with diabetic neuropathy, unspecified: Secondary | ICD-10-CM | POA: Diagnosis not present

## 2020-08-20 DIAGNOSIS — L110 Acquired keratosis follicularis: Secondary | ICD-10-CM | POA: Diagnosis not present

## 2020-08-20 DIAGNOSIS — M79671 Pain in right foot: Secondary | ICD-10-CM | POA: Diagnosis not present

## 2020-08-20 DIAGNOSIS — M79675 Pain in left toe(s): Secondary | ICD-10-CM | POA: Diagnosis not present

## 2020-08-20 DIAGNOSIS — M79674 Pain in right toe(s): Secondary | ICD-10-CM | POA: Diagnosis not present

## 2020-09-09 DIAGNOSIS — G894 Chronic pain syndrome: Secondary | ICD-10-CM | POA: Diagnosis not present

## 2020-09-09 DIAGNOSIS — M542 Cervicalgia: Secondary | ICD-10-CM | POA: Diagnosis not present

## 2020-09-09 DIAGNOSIS — M79603 Pain in arm, unspecified: Secondary | ICD-10-CM | POA: Diagnosis not present

## 2020-09-09 DIAGNOSIS — M5382 Other specified dorsopathies, cervical region: Secondary | ICD-10-CM | POA: Diagnosis not present

## 2020-09-14 ENCOUNTER — Other Ambulatory Visit (HOSPITAL_COMMUNITY): Payer: Self-pay | Admitting: Pain Medicine

## 2020-09-14 DIAGNOSIS — G8929 Other chronic pain: Secondary | ICD-10-CM

## 2020-09-14 DIAGNOSIS — M542 Cervicalgia: Secondary | ICD-10-CM

## 2020-09-17 DIAGNOSIS — Z23 Encounter for immunization: Secondary | ICD-10-CM | POA: Diagnosis not present

## 2020-10-02 ENCOUNTER — Ambulatory Visit (HOSPITAL_COMMUNITY): Payer: Medicare Other

## 2020-10-06 ENCOUNTER — Ambulatory Visit (HOSPITAL_COMMUNITY)
Admission: RE | Admit: 2020-10-06 | Discharge: 2020-10-06 | Disposition: A | Discharge status: Home / Self Care | Payer: Medicare Other | Source: Ambulatory Visit | Attending: Pain Medicine | Admitting: Pain Medicine

## 2020-10-06 DIAGNOSIS — G8929 Other chronic pain: Secondary | ICD-10-CM | POA: Insufficient documentation

## 2020-10-06 DIAGNOSIS — M79603 Pain in arm, unspecified: Secondary | ICD-10-CM | POA: Diagnosis not present

## 2020-10-06 DIAGNOSIS — M542 Cervicalgia: Secondary | ICD-10-CM | POA: Insufficient documentation

## 2020-10-07 DIAGNOSIS — H26492 Other secondary cataract, left eye: Secondary | ICD-10-CM | POA: Diagnosis not present

## 2020-10-07 DIAGNOSIS — E119 Type 2 diabetes mellitus without complications: Secondary | ICD-10-CM | POA: Diagnosis not present

## 2020-10-07 DIAGNOSIS — H25811 Combined forms of age-related cataract, right eye: Secondary | ICD-10-CM | POA: Diagnosis not present

## 2020-10-07 DIAGNOSIS — Z961 Presence of intraocular lens: Secondary | ICD-10-CM | POA: Diagnosis not present

## 2020-10-07 DIAGNOSIS — G4730 Sleep apnea, unspecified: Secondary | ICD-10-CM | POA: Diagnosis not present

## 2020-10-07 DIAGNOSIS — H04123 Dry eye syndrome of bilateral lacrimal glands: Secondary | ICD-10-CM | POA: Diagnosis not present

## 2020-10-12 DIAGNOSIS — M79603 Pain in arm, unspecified: Secondary | ICD-10-CM | POA: Diagnosis not present

## 2020-10-12 DIAGNOSIS — M542 Cervicalgia: Secondary | ICD-10-CM | POA: Diagnosis not present

## 2020-10-12 DIAGNOSIS — G894 Chronic pain syndrome: Secondary | ICD-10-CM | POA: Diagnosis not present

## 2020-10-12 DIAGNOSIS — M4722 Other spondylosis with radiculopathy, cervical region: Secondary | ICD-10-CM | POA: Diagnosis not present

## 2020-11-09 DIAGNOSIS — G4700 Insomnia, unspecified: Secondary | ICD-10-CM | POA: Diagnosis not present

## 2020-11-13 DIAGNOSIS — E118 Type 2 diabetes mellitus with unspecified complications: Secondary | ICD-10-CM | POA: Diagnosis not present

## 2020-11-13 DIAGNOSIS — I10 Essential (primary) hypertension: Secondary | ICD-10-CM | POA: Diagnosis not present

## 2020-11-18 DIAGNOSIS — R945 Abnormal results of liver function studies: Secondary | ICD-10-CM | POA: Insufficient documentation

## 2020-11-18 DIAGNOSIS — M5030 Other cervical disc degeneration, unspecified cervical region: Secondary | ICD-10-CM | POA: Diagnosis not present

## 2020-11-18 DIAGNOSIS — G4700 Insomnia, unspecified: Secondary | ICD-10-CM | POA: Diagnosis not present

## 2020-11-18 DIAGNOSIS — G4733 Obstructive sleep apnea (adult) (pediatric): Secondary | ICD-10-CM | POA: Diagnosis not present

## 2020-11-18 DIAGNOSIS — F17201 Nicotine dependence, unspecified, in remission: Secondary | ICD-10-CM | POA: Diagnosis not present

## 2020-11-18 DIAGNOSIS — I10 Essential (primary) hypertension: Secondary | ICD-10-CM | POA: Diagnosis not present

## 2020-11-18 DIAGNOSIS — E1169 Type 2 diabetes mellitus with other specified complication: Secondary | ICD-10-CM | POA: Diagnosis not present

## 2020-11-18 DIAGNOSIS — D72829 Elevated white blood cell count, unspecified: Secondary | ICD-10-CM | POA: Diagnosis not present

## 2020-11-18 DIAGNOSIS — R944 Abnormal results of kidney function studies: Secondary | ICD-10-CM | POA: Diagnosis not present

## 2020-11-18 DIAGNOSIS — N289 Disorder of kidney and ureter, unspecified: Secondary | ICD-10-CM | POA: Insufficient documentation

## 2020-11-18 DIAGNOSIS — E782 Mixed hyperlipidemia: Secondary | ICD-10-CM | POA: Diagnosis not present

## 2020-11-19 DIAGNOSIS — H2511 Age-related nuclear cataract, right eye: Secondary | ICD-10-CM | POA: Diagnosis not present

## 2020-12-03 DIAGNOSIS — M79675 Pain in left toe(s): Secondary | ICD-10-CM | POA: Diagnosis not present

## 2020-12-03 DIAGNOSIS — M79674 Pain in right toe(s): Secondary | ICD-10-CM | POA: Diagnosis not present

## 2020-12-03 DIAGNOSIS — I739 Peripheral vascular disease, unspecified: Secondary | ICD-10-CM | POA: Diagnosis not present

## 2020-12-03 DIAGNOSIS — L110 Acquired keratosis follicularis: Secondary | ICD-10-CM | POA: Diagnosis not present

## 2020-12-03 DIAGNOSIS — M79672 Pain in left foot: Secondary | ICD-10-CM | POA: Diagnosis not present

## 2020-12-03 DIAGNOSIS — E1140 Type 2 diabetes mellitus with diabetic neuropathy, unspecified: Secondary | ICD-10-CM | POA: Diagnosis not present

## 2020-12-03 DIAGNOSIS — M79671 Pain in right foot: Secondary | ICD-10-CM | POA: Diagnosis not present

## 2021-01-04 DIAGNOSIS — H26492 Other secondary cataract, left eye: Secondary | ICD-10-CM | POA: Diagnosis not present

## 2021-02-11 DIAGNOSIS — Z23 Encounter for immunization: Secondary | ICD-10-CM | POA: Diagnosis not present

## 2021-02-15 DIAGNOSIS — M47892 Other spondylosis, cervical region: Secondary | ICD-10-CM | POA: Diagnosis not present

## 2021-02-18 DIAGNOSIS — Z23 Encounter for immunization: Secondary | ICD-10-CM | POA: Diagnosis not present

## 2021-04-05 DIAGNOSIS — I10 Essential (primary) hypertension: Secondary | ICD-10-CM | POA: Diagnosis not present

## 2021-04-05 DIAGNOSIS — E1169 Type 2 diabetes mellitus with other specified complication: Secondary | ICD-10-CM | POA: Diagnosis not present

## 2021-04-12 DIAGNOSIS — G4733 Obstructive sleep apnea (adult) (pediatric): Secondary | ICD-10-CM | POA: Diagnosis not present

## 2021-04-12 DIAGNOSIS — M5030 Other cervical disc degeneration, unspecified cervical region: Secondary | ICD-10-CM | POA: Diagnosis not present

## 2021-04-12 DIAGNOSIS — G4700 Insomnia, unspecified: Secondary | ICD-10-CM | POA: Diagnosis not present

## 2021-04-12 DIAGNOSIS — E782 Mixed hyperlipidemia: Secondary | ICD-10-CM | POA: Diagnosis not present

## 2021-04-12 DIAGNOSIS — R944 Abnormal results of kidney function studies: Secondary | ICD-10-CM | POA: Diagnosis not present

## 2021-04-12 DIAGNOSIS — M79605 Pain in left leg: Secondary | ICD-10-CM | POA: Insufficient documentation

## 2021-04-12 DIAGNOSIS — R945 Abnormal results of liver function studies: Secondary | ICD-10-CM | POA: Diagnosis not present

## 2021-04-12 DIAGNOSIS — F17201 Nicotine dependence, unspecified, in remission: Secondary | ICD-10-CM | POA: Diagnosis not present

## 2021-04-12 DIAGNOSIS — Z0001 Encounter for general adult medical examination with abnormal findings: Secondary | ICD-10-CM | POA: Diagnosis not present

## 2021-04-12 DIAGNOSIS — E1169 Type 2 diabetes mellitus with other specified complication: Secondary | ICD-10-CM | POA: Diagnosis not present

## 2021-04-12 DIAGNOSIS — D72829 Elevated white blood cell count, unspecified: Secondary | ICD-10-CM | POA: Diagnosis not present

## 2021-04-12 DIAGNOSIS — I10 Essential (primary) hypertension: Secondary | ICD-10-CM | POA: Diagnosis not present

## 2021-04-14 ENCOUNTER — Telehealth: Payer: Self-pay | Admitting: Pharmacy Technician

## 2021-04-14 DIAGNOSIS — Z596 Low income: Secondary | ICD-10-CM

## 2021-04-14 NOTE — Progress Notes (Signed)
[facility])                                            [place] Team    04/14/2021  Andre [name] Nov 23, 1945 [id number]  FOR 2023 RE ENROLLMENT                                      Medication Assistance Referral  Referral From: [place]  Medication/Company: [employer]esta Gentile / [employer] Patient application portion:  Mailed to Andre [name] per patient request Provider application portion:  Mailed to Dr. Emi Belfast Provider address/fax verified via: Office website  [name] P. Manisha Cancel, [place]  [phone]

## 2021-04-27 ENCOUNTER — Telehealth: Payer: Self-pay | Admitting: Pharmacy Technician

## 2021-04-27 DIAGNOSIS — Z596 Low income: Secondary | ICD-10-CM

## 2021-04-27 NOTE — Progress Notes (Signed)
[facility])                                            [place] Team    04/27/2021  Andre [name] Jun 16, 1945 [id number]  Received both patient and provider portion(s) of patient assistance application(s) for Januvia. Mailed completed application and required documents into [employer].    Renita Brocks P. [name], [place]  [phone]

## 2021-06-14 ENCOUNTER — Telehealth: Payer: Self-pay | Admitting: Pharmacy Technician

## 2021-06-14 DIAGNOSIS — Z596 Low income: Secondary | ICD-10-CM

## 2021-06-14 NOTE — Progress Notes (Signed)
[facility])                                            [place] Team    06/14/2021  Andre [name] Feb 11, 1946 [id number]  Care coordination call placed to [employer] in regard to [facility] application.  Spoke to [place] who informs patient was APPROVED 06/01/21-06/05/22. She informs medication will be shipped in January based on last fill date in 2022 and will be delivered to patient's home address on file.  [name] P. Julius Boniface, [place]  [phone]

## 2021-07-08 ENCOUNTER — Other Ambulatory Visit (HOSPITAL_COMMUNITY): Payer: Self-pay | Admitting: Internal Medicine

## 2021-07-08 ENCOUNTER — Other Ambulatory Visit: Payer: Self-pay | Admitting: Internal Medicine

## 2021-07-08 DIAGNOSIS — H9193 Unspecified hearing loss, bilateral: Secondary | ICD-10-CM | POA: Diagnosis not present

## 2021-07-08 DIAGNOSIS — R519 Headache, unspecified: Secondary | ICD-10-CM | POA: Diagnosis not present

## 2021-07-08 DIAGNOSIS — R42 Dizziness and giddiness: Secondary | ICD-10-CM

## 2021-07-15 IMAGING — DX DG SHOULDER 2+V*L*
2 series · 2 of 2 positions shown · non-contrast
Comparison: None.

CLINICAL DATA: Pain

EXAM:
LEFT SHOULDER - 2+ VIEW

[shoulder grashey]
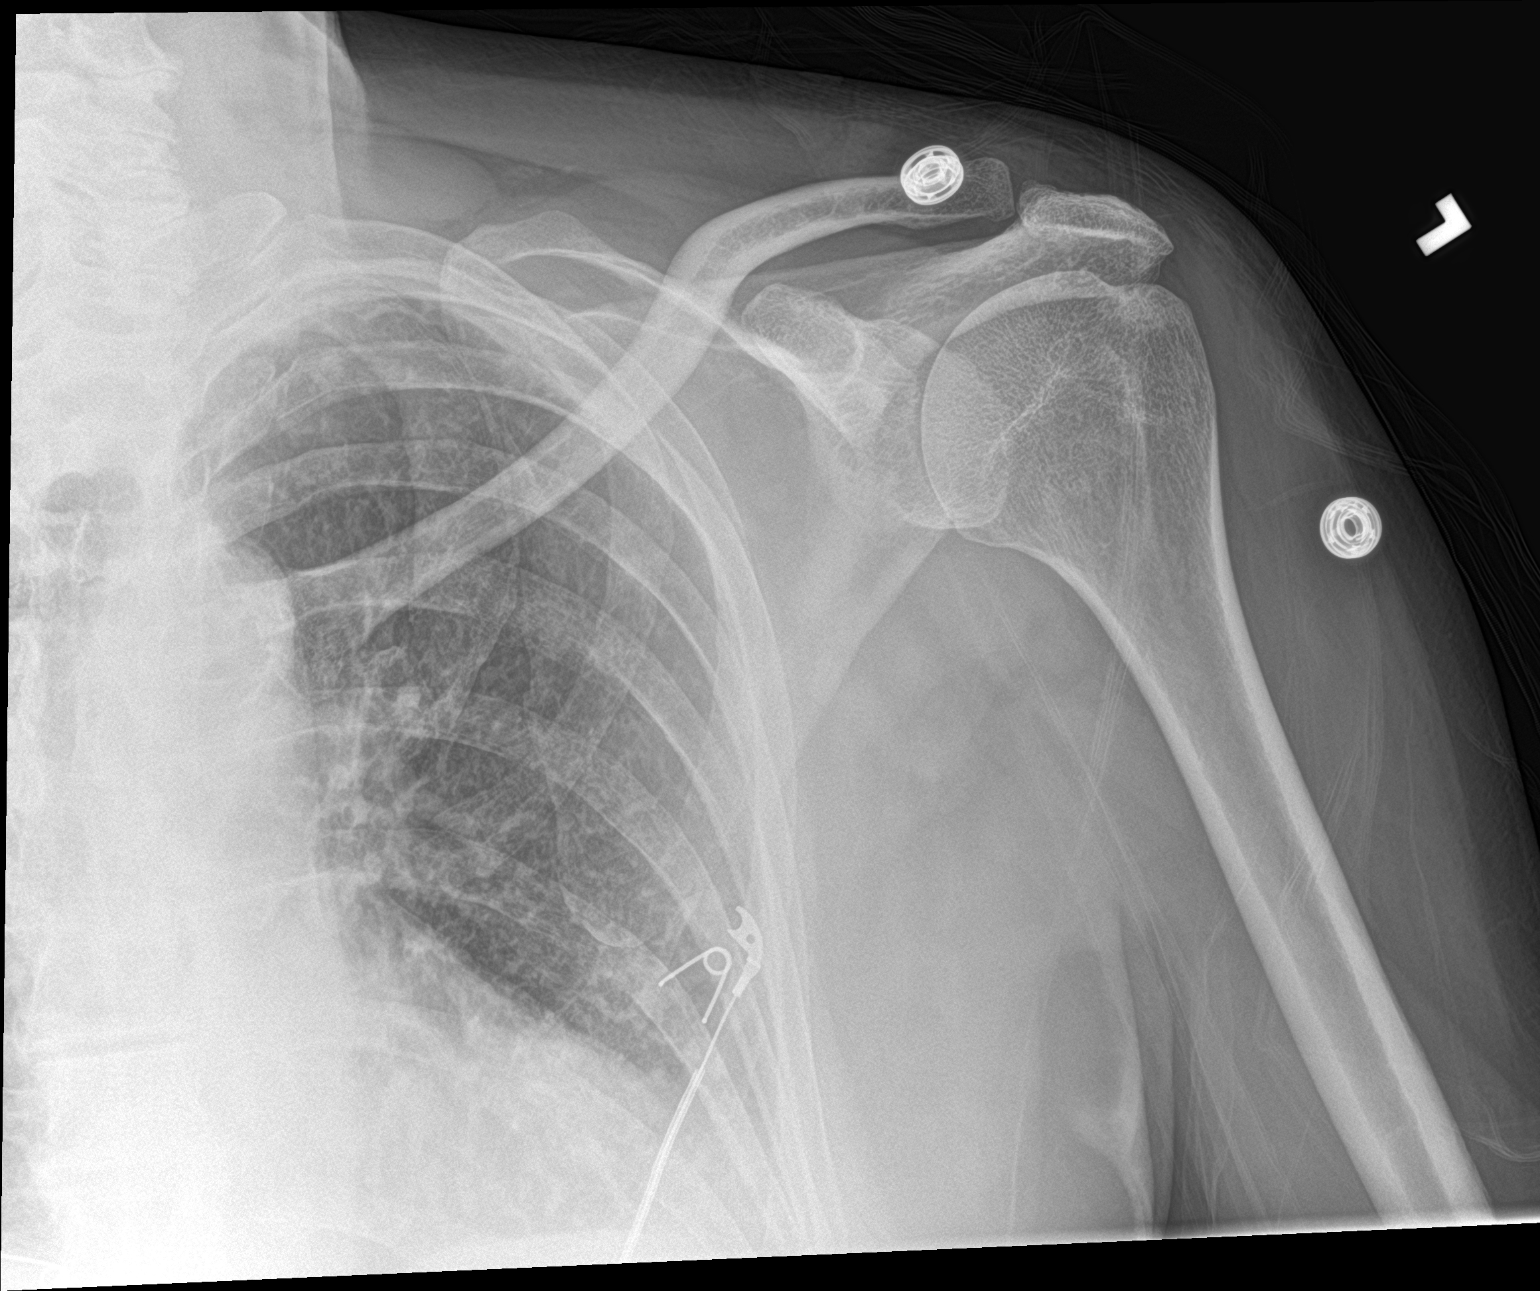

[shoulder y view]
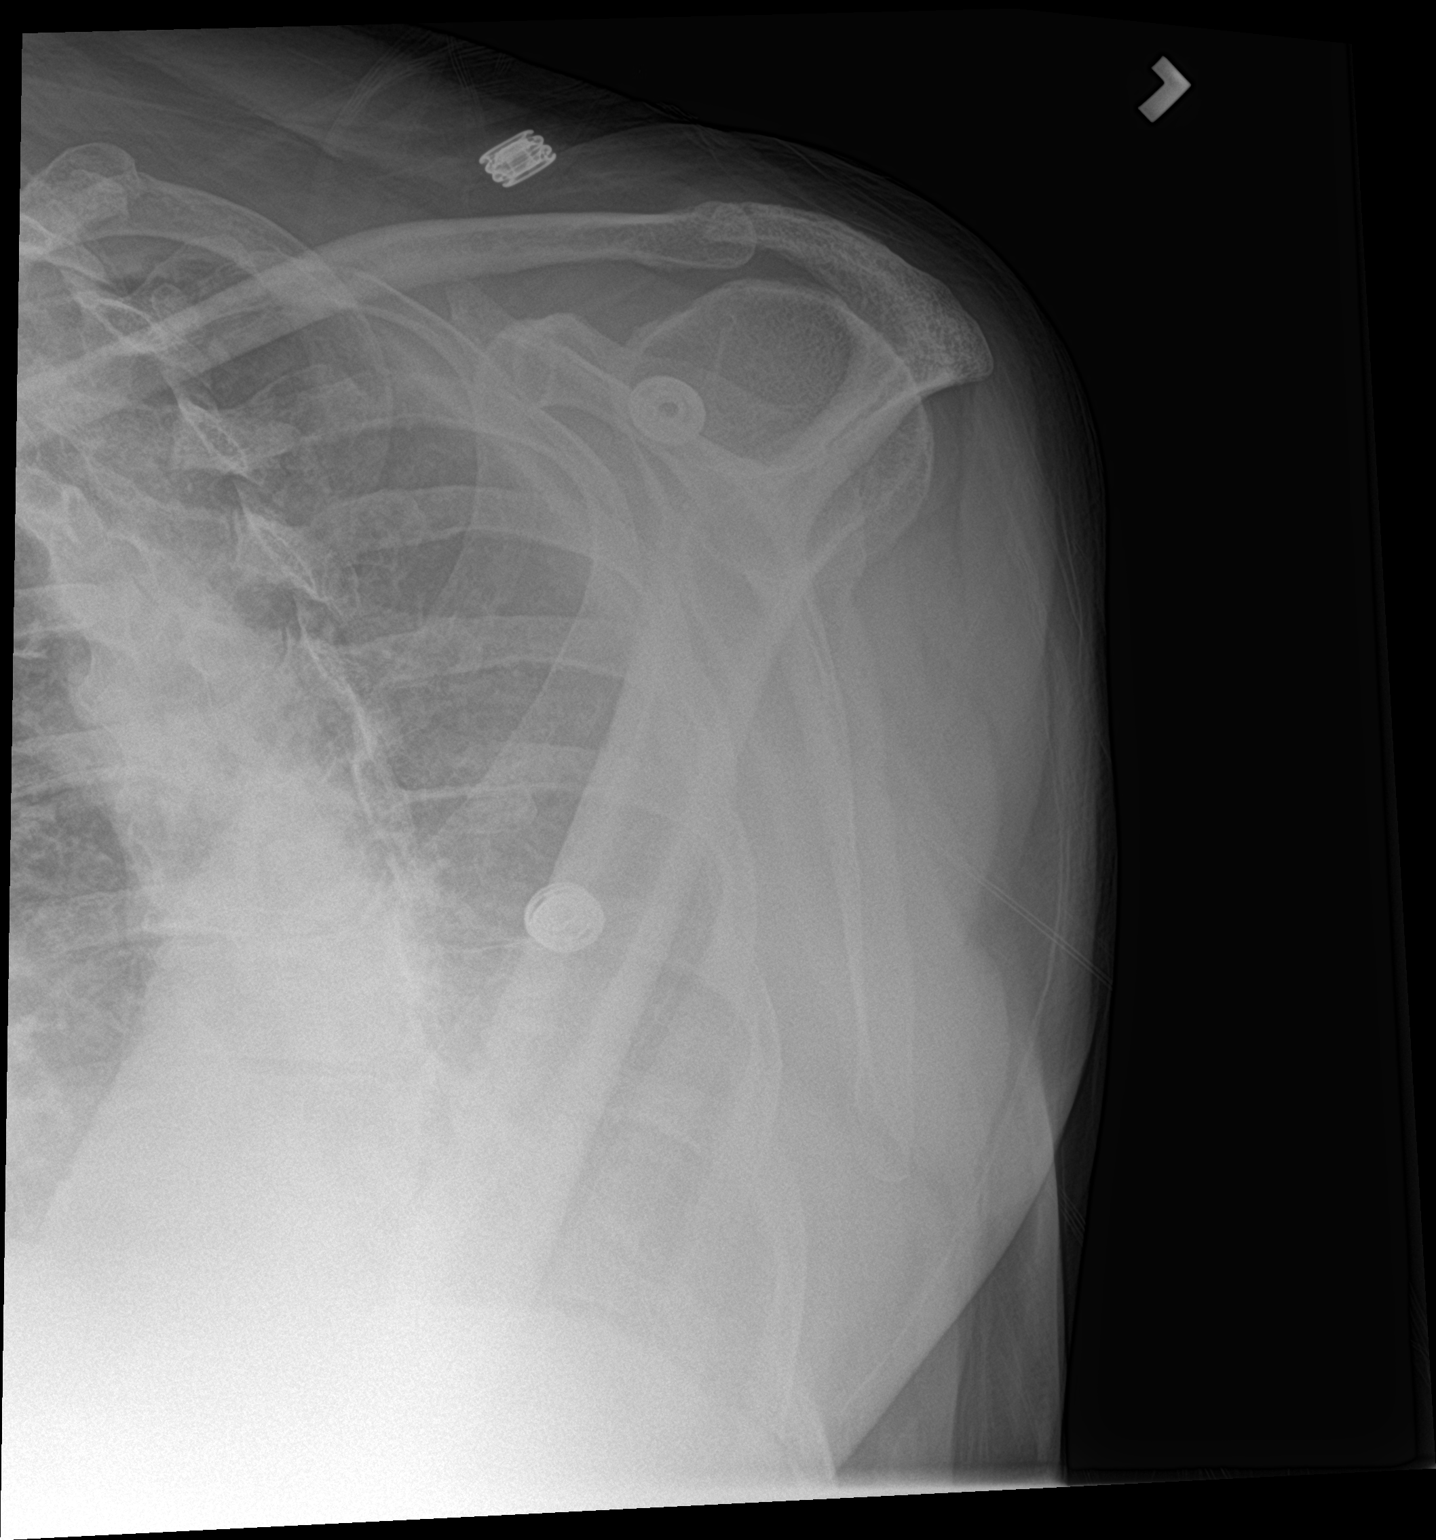

[2 of 2 positions shown; findings below may reference images not displayed]

FINDINGS: Frontal and Y scapular images were obtained. No fracture or
dislocation. No appreciable joint space narrowing or erosion.
Visualized left lung clear.
IMPRESSION: No fracture or dislocation.  No appreciable arthropathy.

## 2021-07-31 DIAGNOSIS — R220 Localized swelling, mass and lump, head: Secondary | ICD-10-CM | POA: Diagnosis not present

## 2021-08-09 ENCOUNTER — Other Ambulatory Visit: Payer: Self-pay

## 2021-08-09 ENCOUNTER — Ambulatory Visit (HOSPITAL_COMMUNITY)
Admission: RE | Admit: 2021-08-09 | Discharge: 2021-08-09 | Disposition: A | Discharge status: Home / Self Care | Payer: Medicare Other | Source: Ambulatory Visit | Attending: Internal Medicine | Admitting: Internal Medicine

## 2021-08-09 DIAGNOSIS — R42 Dizziness and giddiness: Secondary | ICD-10-CM | POA: Diagnosis not present

## 2021-08-09 DIAGNOSIS — R519 Headache, unspecified: Secondary | ICD-10-CM | POA: Diagnosis not present

## 2021-08-19 DIAGNOSIS — E782 Mixed hyperlipidemia: Secondary | ICD-10-CM | POA: Diagnosis not present

## 2021-08-19 DIAGNOSIS — E1169 Type 2 diabetes mellitus with other specified complication: Secondary | ICD-10-CM | POA: Diagnosis not present

## 2021-08-23 DIAGNOSIS — G4700 Insomnia, unspecified: Secondary | ICD-10-CM | POA: Diagnosis not present

## 2021-08-23 DIAGNOSIS — G4733 Obstructive sleep apnea (adult) (pediatric): Secondary | ICD-10-CM | POA: Diagnosis not present

## 2021-08-23 DIAGNOSIS — M5030 Other cervical disc degeneration, unspecified cervical region: Secondary | ICD-10-CM | POA: Diagnosis not present

## 2021-08-23 DIAGNOSIS — R945 Abnormal results of liver function studies: Secondary | ICD-10-CM | POA: Diagnosis not present

## 2021-08-23 DIAGNOSIS — E1169 Type 2 diabetes mellitus with other specified complication: Secondary | ICD-10-CM | POA: Diagnosis not present

## 2021-08-23 DIAGNOSIS — R944 Abnormal results of kidney function studies: Secondary | ICD-10-CM | POA: Diagnosis not present

## 2021-08-23 DIAGNOSIS — D72829 Elevated white blood cell count, unspecified: Secondary | ICD-10-CM | POA: Diagnosis not present

## 2021-08-23 DIAGNOSIS — E782 Mixed hyperlipidemia: Secondary | ICD-10-CM | POA: Diagnosis not present

## 2021-08-23 DIAGNOSIS — I10 Essential (primary) hypertension: Secondary | ICD-10-CM | POA: Diagnosis not present

## 2021-08-23 DIAGNOSIS — F17201 Nicotine dependence, unspecified, in remission: Secondary | ICD-10-CM | POA: Diagnosis not present

## 2021-08-23 DIAGNOSIS — H04209 Unspecified epiphora, unspecified lacrimal gland: Secondary | ICD-10-CM | POA: Diagnosis not present

## 2021-08-24 DIAGNOSIS — I10 Essential (primary) hypertension: Secondary | ICD-10-CM | POA: Diagnosis not present

## 2021-08-24 DIAGNOSIS — M530 Cervicocranial syndrome: Secondary | ICD-10-CM | POA: Diagnosis not present

## 2021-08-24 DIAGNOSIS — G4452 New daily persistent headache (NDPH): Secondary | ICD-10-CM | POA: Diagnosis not present

## 2021-08-24 DIAGNOSIS — E119 Type 2 diabetes mellitus without complications: Secondary | ICD-10-CM | POA: Diagnosis not present

## 2021-09-21 DIAGNOSIS — M545 Low back pain, unspecified: Secondary | ICD-10-CM | POA: Diagnosis not present

## 2021-09-21 DIAGNOSIS — R519 Headache, unspecified: Secondary | ICD-10-CM | POA: Diagnosis not present

## 2021-10-14 DIAGNOSIS — M79671 Pain in right foot: Secondary | ICD-10-CM | POA: Diagnosis not present

## 2021-10-14 DIAGNOSIS — L110 Acquired keratosis follicularis: Secondary | ICD-10-CM | POA: Diagnosis not present

## 2021-10-14 DIAGNOSIS — E1140 Type 2 diabetes mellitus with diabetic neuropathy, unspecified: Secondary | ICD-10-CM | POA: Diagnosis not present

## 2021-10-14 DIAGNOSIS — M79672 Pain in left foot: Secondary | ICD-10-CM | POA: Diagnosis not present

## 2021-10-14 DIAGNOSIS — M79675 Pain in left toe(s): Secondary | ICD-10-CM | POA: Diagnosis not present

## 2021-10-14 DIAGNOSIS — I739 Peripheral vascular disease, unspecified: Secondary | ICD-10-CM | POA: Diagnosis not present

## 2021-10-14 DIAGNOSIS — M79674 Pain in right toe(s): Secondary | ICD-10-CM | POA: Diagnosis not present

## 2021-10-26 DIAGNOSIS — J449 Chronic obstructive pulmonary disease, unspecified: Secondary | ICD-10-CM | POA: Diagnosis not present

## 2021-10-26 DIAGNOSIS — E1169 Type 2 diabetes mellitus with other specified complication: Secondary | ICD-10-CM | POA: Diagnosis not present

## 2021-10-26 DIAGNOSIS — R519 Headache, unspecified: Secondary | ICD-10-CM | POA: Diagnosis not present

## 2021-10-26 DIAGNOSIS — E782 Mixed hyperlipidemia: Secondary | ICD-10-CM | POA: Diagnosis not present

## 2021-10-26 DIAGNOSIS — I10 Essential (primary) hypertension: Secondary | ICD-10-CM | POA: Diagnosis not present

## 2021-10-26 DIAGNOSIS — G4733 Obstructive sleep apnea (adult) (pediatric): Secondary | ICD-10-CM | POA: Diagnosis not present

## 2021-10-26 DIAGNOSIS — M5030 Other cervical disc degeneration, unspecified cervical region: Secondary | ICD-10-CM | POA: Diagnosis not present

## 2021-10-26 DIAGNOSIS — R079 Chest pain, unspecified: Secondary | ICD-10-CM | POA: Diagnosis not present

## 2021-10-26 DIAGNOSIS — R944 Abnormal results of kidney function studies: Secondary | ICD-10-CM | POA: Diagnosis not present

## 2021-10-26 DIAGNOSIS — F17201 Nicotine dependence, unspecified, in remission: Secondary | ICD-10-CM | POA: Diagnosis not present

## 2021-11-02 DIAGNOSIS — E119 Type 2 diabetes mellitus without complications: Secondary | ICD-10-CM | POA: Diagnosis not present

## 2021-11-02 DIAGNOSIS — I10 Essential (primary) hypertension: Secondary | ICD-10-CM | POA: Diagnosis not present

## 2021-11-02 DIAGNOSIS — M530 Cervicocranial syndrome: Secondary | ICD-10-CM | POA: Diagnosis not present

## 2021-11-02 DIAGNOSIS — G4452 New daily persistent headache (NDPH): Secondary | ICD-10-CM | POA: Diagnosis not present

## 2021-11-24 IMAGING — MR MR CERVICAL SPINE W/O CM
5 series · 40 of 48 positions shown · non-contrast
Comparison: Cervical spine radiographs 11/25/2019

CLINICAL DATA: Neck pain with bilateral arm pain.

EXAM:
MRI CERVICAL SPINE WITHOUT CONTRAST
TECHNIQUE: Multiplanar, multisequence MR imaging of the cervical spine was
performed. No intravenous contrast was administered.

[Series 5: T2 · sagittal · 3.0mm · 0.69mm/px · 6 of 15 slices shown (1 of 2)]
[im 1/15]
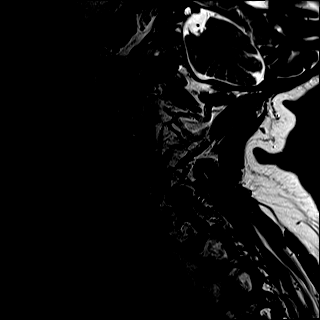
[im 3/15]
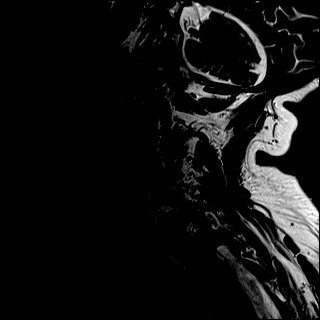
[im 6/15]
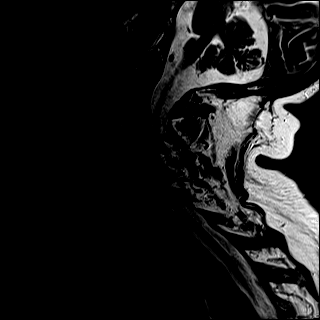
[im 9/15]
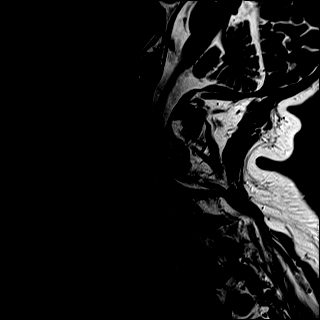
[im 12/15]
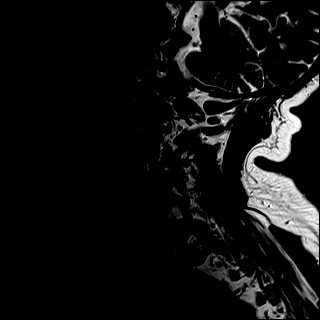
[im 15/15]
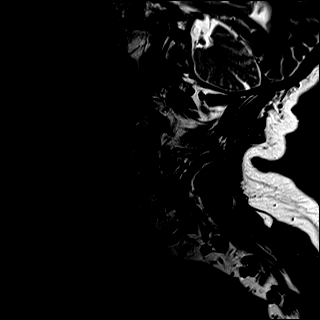

[Series 6: T1 · sagittal · 3.0mm · 0.86mm/px · 6 of 15 slices shown]
[im 1/15]
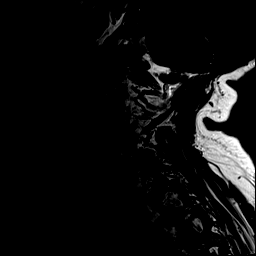
[im 3/15]
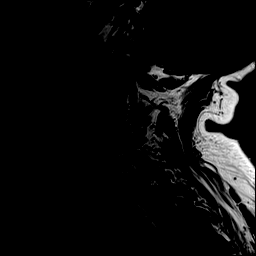
[im 6/15]
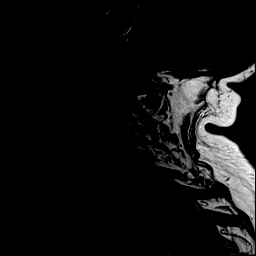
[im 9/15]
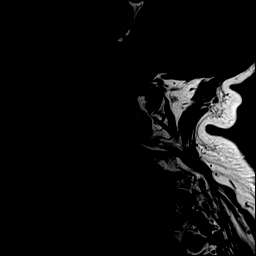
[im 12/15]
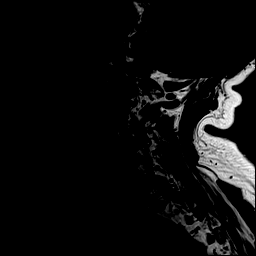
[im 15/15]
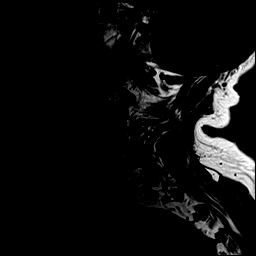

[Series 7: STIR · sagittal · 3.0mm · 0.69mm/px · 6 of 15 slices shown]
[im 1/15]
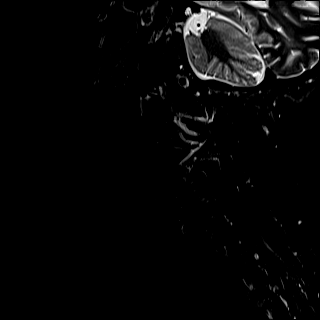
[im 3/15]
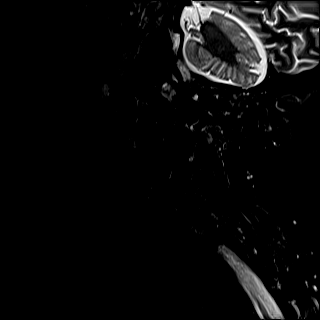
[im 6/15]
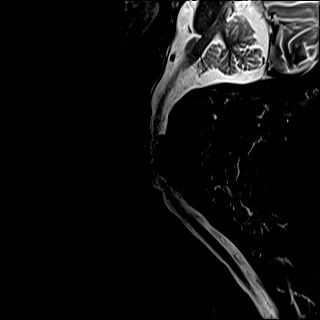
[im 9/15]
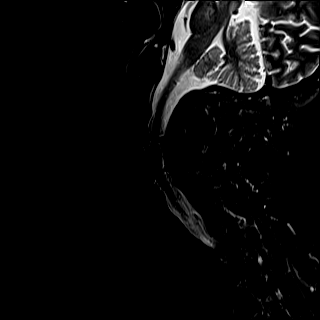
[im 12/15]
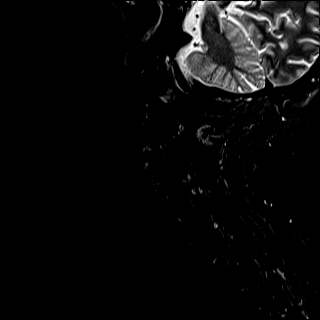
[im 15/15]
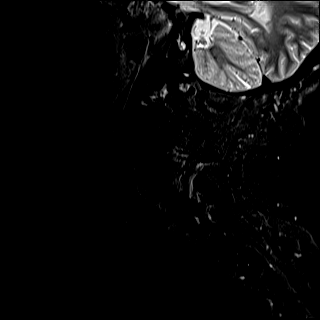

[Series 8: T2 · axial · 3.0mm · 0.70mm/px · z∈[-129,-14]mm · 14 of 36 slices shown (2 of 2)]
[im 1/36]
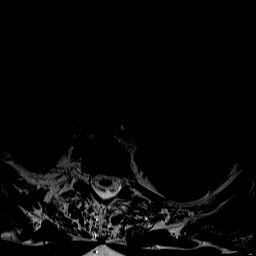
[im 3/36]
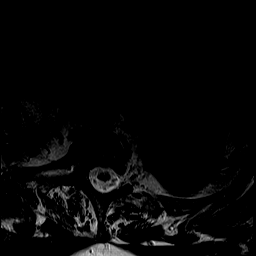
[im 6/36]
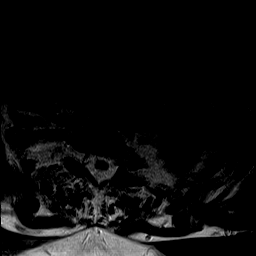
[im 8/36]
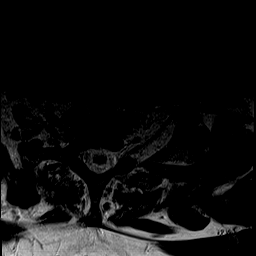
[im 11/36]
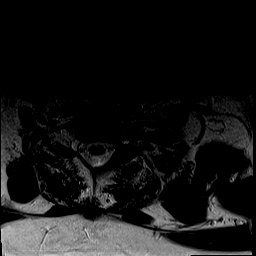
[im 13/36]
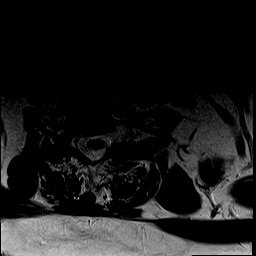
[im 16/36]
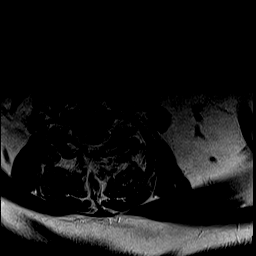
[im 18/36]
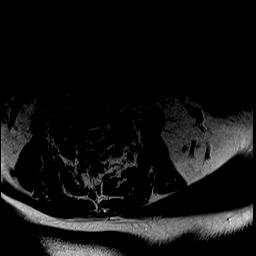
[im 21/36]
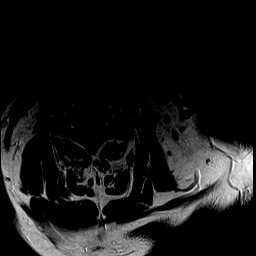
[im 23/36]
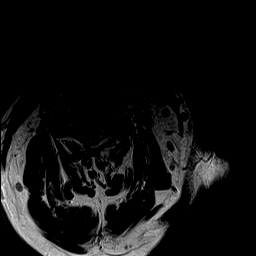
[im 26/36]
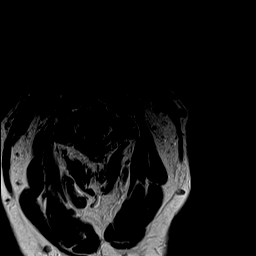
[im 28/36]
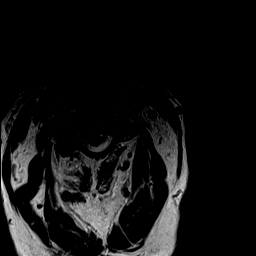
[im 31/36]
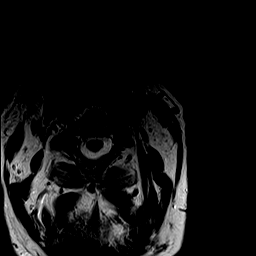
[im 36/36]
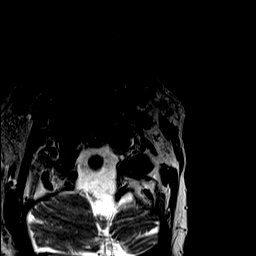

[Series 9: GRE · axial · 3.0mm · 0.35mm/px · z∈[-129,-14]mm · 8 of 36 slices shown]
[im 1/36]
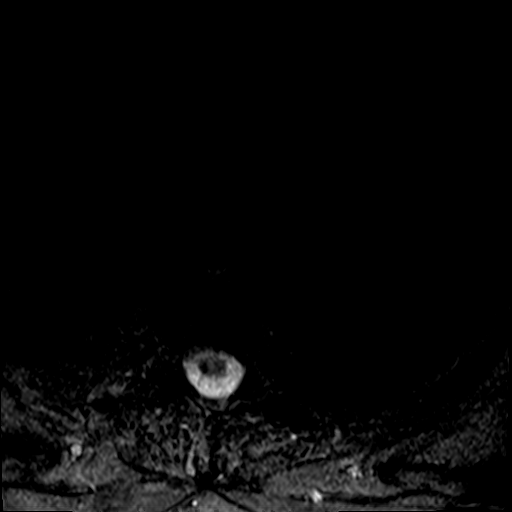
[im 6/36]
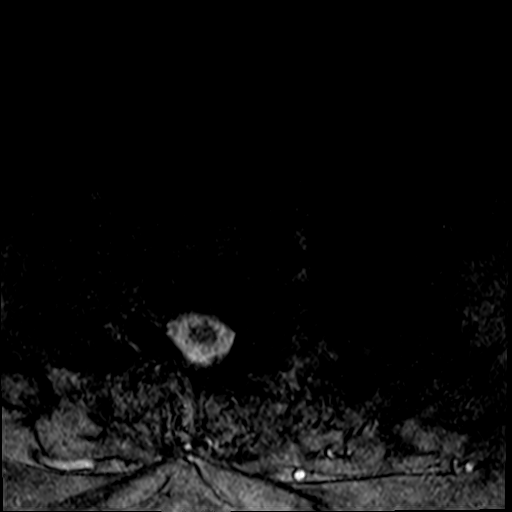
[im 11/36]
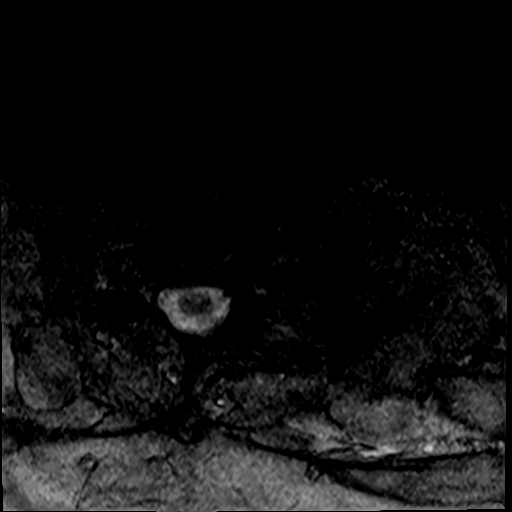
[im 16/36]
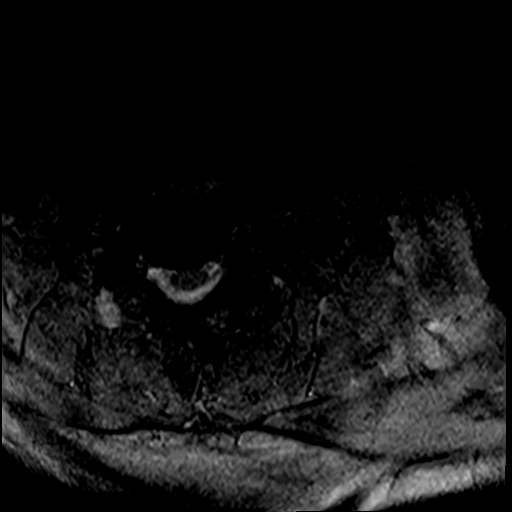
[im 21/36]
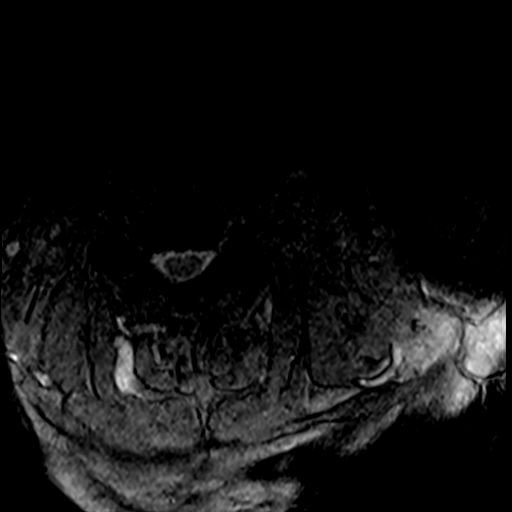
[im 26/36]
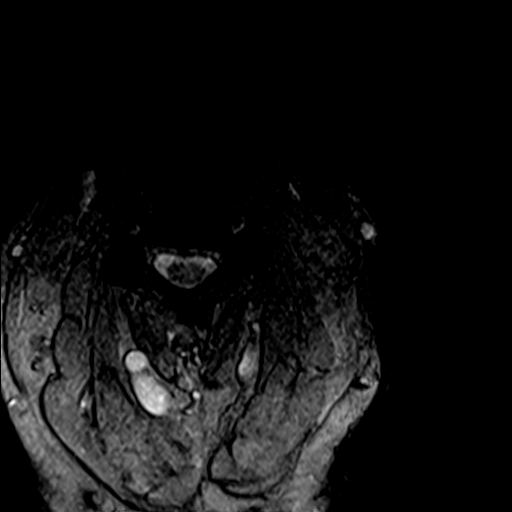
[im 31/36]
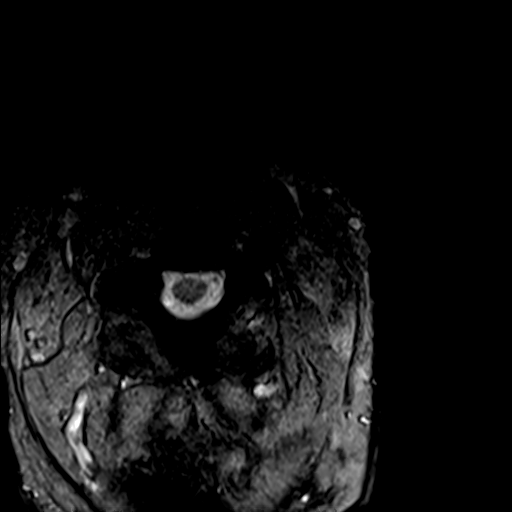
[im 36/36]
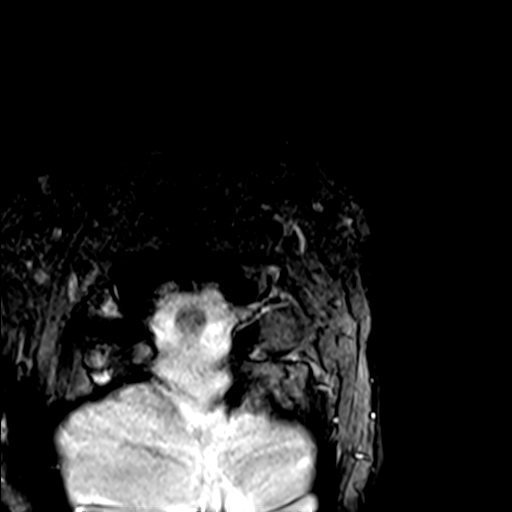

[40 of 48 positions shown; findings below may reference images not displayed]

FINDINGS: Alignment: Normal alignment.

Vertebrae: Degenerative endplate changes are seen in the cervical
spine. Negative for fracture or mass.

Cord: Normal signal and morphology

Posterior Fossa, vertebral arteries, paraspinal tissues: Negative

Disc levels:

C2-3: Mild disc degeneration and spurring. Mild foraminal narrowing
bilaterally

C3-4: Disc degeneration with diffuse uncinate spurring. Cord
flattening and mild spinal stenosis. Moderate to severe left
foraminal encroachment due to spurring. Bilateral facet hypertrophy.
Right foramen patent

C4-5: Disc degeneration with diffuse uncinate spurring left greater
than right. Moderate left foraminal narrowing and mild right
foraminal narrowing. Mild spinal stenosis.

C5-6: Disc degeneration with diffuse uncinate spurring. Mild
foraminal narrowing bilaterally.

C6-7: Disc degeneration with diffuse uncinate spurring. No
significant stenosis

C7-T1: Negative for stenosis.
IMPRESSION: Multilevel degenerative change in the cervical spine. Foraminal
narrowing most severe on the left at C3-4 and C4-5.

## 2021-11-26 DIAGNOSIS — E1169 Type 2 diabetes mellitus with other specified complication: Secondary | ICD-10-CM | POA: Diagnosis not present

## 2021-11-26 DIAGNOSIS — E782 Mixed hyperlipidemia: Secondary | ICD-10-CM | POA: Diagnosis not present

## 2021-12-03 ENCOUNTER — Other Ambulatory Visit: Payer: Self-pay | Admitting: Internal Medicine

## 2021-12-03 ENCOUNTER — Other Ambulatory Visit (HOSPITAL_COMMUNITY): Payer: Self-pay | Admitting: Internal Medicine

## 2021-12-03 DIAGNOSIS — D72829 Elevated white blood cell count, unspecified: Secondary | ICD-10-CM | POA: Diagnosis not present

## 2021-12-03 DIAGNOSIS — H04209 Unspecified epiphora, unspecified lacrimal gland: Secondary | ICD-10-CM | POA: Diagnosis not present

## 2021-12-03 DIAGNOSIS — R945 Abnormal results of liver function studies: Secondary | ICD-10-CM | POA: Diagnosis not present

## 2021-12-03 DIAGNOSIS — E1169 Type 2 diabetes mellitus with other specified complication: Secondary | ICD-10-CM | POA: Diagnosis not present

## 2021-12-03 DIAGNOSIS — R944 Abnormal results of kidney function studies: Secondary | ICD-10-CM | POA: Diagnosis not present

## 2021-12-03 DIAGNOSIS — G4733 Obstructive sleep apnea (adult) (pediatric): Secondary | ICD-10-CM | POA: Diagnosis not present

## 2021-12-03 DIAGNOSIS — M5030 Other cervical disc degeneration, unspecified cervical region: Secondary | ICD-10-CM | POA: Diagnosis not present

## 2021-12-03 DIAGNOSIS — I10 Essential (primary) hypertension: Secondary | ICD-10-CM | POA: Diagnosis not present

## 2021-12-03 DIAGNOSIS — E782 Mixed hyperlipidemia: Secondary | ICD-10-CM | POA: Diagnosis not present

## 2021-12-03 DIAGNOSIS — F17201 Nicotine dependence, unspecified, in remission: Secondary | ICD-10-CM | POA: Diagnosis not present

## 2021-12-03 DIAGNOSIS — G4700 Insomnia, unspecified: Secondary | ICD-10-CM | POA: Diagnosis not present

## 2021-12-10 DIAGNOSIS — H905 Unspecified sensorineural hearing loss: Secondary | ICD-10-CM | POA: Diagnosis not present

## 2021-12-27 ENCOUNTER — Ambulatory Visit: Payer: Medicare Other | Admitting: Cardiology

## 2021-12-27 ENCOUNTER — Encounter: Payer: Self-pay | Admitting: Cardiology

## 2021-12-27 ENCOUNTER — Encounter: Payer: Self-pay | Admitting: *Deleted

## 2021-12-27 VITALS — BP 132/74 | HR 74 | Ht 73.0 in | Wt 206.0 lb

## 2021-12-27 DIAGNOSIS — I10 Essential (primary) hypertension: Secondary | ICD-10-CM | POA: Diagnosis not present

## 2021-12-27 DIAGNOSIS — E119 Type 2 diabetes mellitus without complications: Secondary | ICD-10-CM | POA: Diagnosis not present

## 2021-12-27 DIAGNOSIS — R079 Chest pain, unspecified: Secondary | ICD-10-CM

## 2021-12-27 DIAGNOSIS — M530 Cervicocranial syndrome: Secondary | ICD-10-CM | POA: Diagnosis not present

## 2021-12-27 DIAGNOSIS — G4452 New daily persistent headache (NDPH): Secondary | ICD-10-CM | POA: Diagnosis not present

## 2021-12-27 NOTE — Progress Notes (Signed)
Clinical Summary Andre [name] is a 76 y.o.male seen as new consult, referred for chest pain  1.Chest pain  - across entire chest, aching pain 7/10 in severity. Can occur at rest or with activity - can have some associated nausea - pain lasts a few minutes, longest episodes 20-30 minutes - some DOE with activities x 6 months - not specific positional   CAD risk factors: HTN, HL, DM2, former smoker x 50 years.       - interested in cardiac/pulm rehab for exercise, his neihbor attends the maintence. Discussed will have to discuss with staff there once stress results are back  Past Medical History:  Diagnosis Date   Adenomatous polyp 2002   tcs by [name]   Diabetes mellitus without complication [facility])    Diverticula of colon 2002   L side   GERD (gastroesophageal reflux disease)    Hemorrhoids 2002   internal and external   Hiatal hernia 2002   moderate   Hyperlipidemia    Hypertension    Mini stroke ([place]) 1998   no deficits   Sleep apnea    does not use CPAP. PCP aware.     No Known Allergies   Current Outpatient Medications  Medication Sig Dispense Refill   aspirin 325 MG tablet Take 325 mg by mouth daily.      atorvastatin (LIPITOR) 20 MG tablet TAKE (1) TABLET BY MOUTH ONCE DAILY. (Patient taking differently: Take 20 mg by mouth daily. ) 30 tablet 0   calcium carbonate (TUMS - DOSED IN MG ELEMENTAL CALCIUM) 500 MG chewable tablet Chew 2 tablets (400 mg of elemental calcium total) by mouth 2 (two) times daily.     diphenoxylate-atropine (LOMOTIL) 2.5-0.025 MG tablet Take 1 tablet by mouth in the morning and at bedtime.     JANUVIA 100 MG tablet Take 100 mg by mouth daily.     losartan (COZAAR) 50 MG tablet Take 50 mg by mouth daily.     metFORMIN (GLUCOPHAGE) 500 MG tablet Take 500 mg by mouth 2 (two) times daily.     omeprazole (PRILOSEC) 20 MG capsule TAKE 1 CAPSULE BY MOUTH ONCE A DAY. 30 capsule 0   ondansetron (ZOFRAN) 4 MG tablet Take 4 mg by mouth  every 6 (six) hours as needed for nausea or vomiting.     traMADol (ULTRAM) 50 MG tablet Take 1 tablet (50 mg total) by mouth every 6 (six) hours as needed. 20 tablet 0   No current facility-administered medications for this visit.     Past Surgical History:  Procedure Laterality Date   CATARACT EXTRACTION W/PHACO Left 12/31/2015   Procedure: CATARACT EXTRACTION PHACO AND INTRAOCULAR LENS PLACEMENT LEFT EYE; CDE: 17.28;  Surgeon: Tonny Kepler Mccabe, MD;  Location: [facility];  Service: Ophthalmology;  Laterality: Left;   CHOLECYSTECTOMY  1990   [name]   COLONOSCOPY  01/23/01   [name]- small internal and external hemorrhoids, L sided mild diverticula. adenomatous polyp removed from rectum.   COLONOSCOPY  12/26/2011   Procedure: COLONOSCOPY;  Surgeon: [name], MD;  Location: [facility];  Service: Endoscopy;  Laterality: N/A;  8:30   ESOPHAGOGASTRODUODENOSCOPY  01/23/01   Dr. 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 He has a 25.00 pack-year smoking history. He has never used smokeless tobacco. Andre [name] reports current alcohol use.   Review of Systems CONSTITUTIONAL: No weight loss, fever, chills, weakness or fatigue.  HEENT: Eyes: No visual loss, blurred vision, double vision or yellow sclerae.No hearing loss, sneezing, congestion, runny nose or sore throat.  SKIN: No rash or itching.  CARDIOVASCULAR: per hpi RESPIRATORY: per hpi GASTROINTESTINAL: No anorexia, nausea, vomiting or diarrhea. No abdominal pain or blood.  GENITOURINARY: No burning on urination, no  polyuria NEUROLOGICAL: No headache, dizziness, syncope, paralysis, ataxia, numbness or tingling in the extremities. No change in bowel or bladder control.  MUSCULOSKELETAL: No muscle, back pain, joint pain or stiffness.  LYMPHATICS: No enlarged nodes. No history of splenectomy.  PSYCHIATRIC: No history of depression or anxiety.  ENDOCRINOLOGIC: No reports of sweating, cold or heat intolerance. No polyuria or polydipsia.  Marland Kitchen   Physical Examination Today's Vitals   12/27/21 1456  BP: 132/74  Pulse: 74  SpO2: 94%  Weight: 206 lb (93.4 kg)  Height: '6\' 1"'$  (1.854 m)   Body mass index is 27.18 kg/m.  Gen: resting comfortably, no acute distress HEENT: no scleral icterus, pupils equal round and reactive, no palptable cervical adenopathy,  CV: RRR, no m/r/g no jvd Resp: Clear to auscultation bilaterally GI: abdomen is soft, non-tender, non-distended, normal bowel sounds, no hepatosplenomegaly MSK: extremities are warm, no edema.  Skin: warm, no rash Neuro:  no focal deficits Psych: appropriate affect    Assessment and Plan  1.Chest pain -unclear etiology, symptoms are mixed - multiple CAD risk factors - will plan for exercise nuclear stress test to further evaluate       Arnoldo Lenis, M.D.

## 2021-12-27 NOTE — Patient Instructions (Addendum)
Medication Instructions:  Your physician recommends that you continue on your current medications as directed. Please refer to the Current Medication list given to you today.  Labwork: none  Testing/Procedures: Your physician has requested that you have en exercise stress myoview. For further information please visit [email or web address]. Please follow instruction sheet, as given.  Follow-Up: Your physician recommends that you schedule a follow-up appointment in: pending  Any Other Special Instructions Will Be Listed Below (If Applicable).  If you need a refill on your cardiac medications before your next appointment, please call your pharmacy.

## 2021-12-30 ENCOUNTER — Telehealth: Payer: Self-pay | Admitting: Cardiology

## 2021-12-30 NOTE — Telephone Encounter (Signed)
  Pat calling, she said, they forgot to mention to Dr. Harl Bowie that pt gets headache now and then and its pretty bad headaches that pt gets. They would like to know if pt gets a headache during stress test if that will interfere with the test

## 2021-12-30 NOTE — Telephone Encounter (Signed)
Patient friend Marcelino Scot) notified and verbalized understanding.

## 2021-12-30 NOTE — Telephone Encounter (Signed)
Would not interfere with stress test  Zandra Abts MD

## 2022-01-03 ENCOUNTER — Ambulatory Visit (HOSPITAL_BASED_OUTPATIENT_CLINIC_OR_DEPARTMENT_OTHER)
Admission: RE | Admit: 2022-01-03 | Discharge: 2022-01-03 | Disposition: A | Discharge status: Home / Self Care | Payer: Medicare Other | Source: Ambulatory Visit | Attending: Cardiology | Admitting: Cardiology

## 2022-01-03 ENCOUNTER — Ambulatory Visit (HOSPITAL_COMMUNITY)
Admission: RE | Admit: 2022-01-03 | Discharge: 2022-01-03 | Disposition: A | Discharge status: Home / Self Care | Payer: Medicare Other | Source: Ambulatory Visit | Attending: Cardiology | Admitting: Cardiology

## 2022-01-03 DIAGNOSIS — R079 Chest pain, unspecified: Secondary | ICD-10-CM

## 2022-01-03 LAB — NM MYOCAR MULTI W/SPECT W/WALL MOTION / EF
LV dias vol: 78 mL (ref 62–150)
LV sys vol: 27 mL
Nuc Stress EF: 66 %
Peak HR: 81 {beats}/min
RATE: 0.7
Rest HR: 57 {beats}/min
Rest Nuclear Isotope Dose: 10.7 mCi
SDS: 0
SRS: 2
SSS: 2
ST Depression (mm): 0 mm
Stress Nuclear Isotope Dose: 32.2 mCi
TID: 1.04

## 2022-01-03 MED ORDER — TECHNETIUM TC 99M TETROFOSMIN IV KIT
10.0000 | PACK | Freq: Once | INTRAVENOUS | Status: AC | PRN
  Administered 2022-01-03: 10.65 via INTRAVENOUS

## 2022-01-03 MED ORDER — SODIUM CHLORIDE FLUSH 0.9 % IV SOLN
INTRAVENOUS | Status: AC
  Administered 2022-01-03: 10 mL via INTRAVENOUS
  Filled 2022-01-03: qty 10

## 2022-01-03 MED ORDER — TECHNETIUM TC 99M TETROFOSMIN IV KIT
30.0000 | PACK | Freq: Once | INTRAVENOUS | Status: AC | PRN
  Administered 2022-01-03: 32.2 via INTRAVENOUS

## 2022-01-03 MED ORDER — REGADENOSON 0.4 MG/5ML IV SOLN
INTRAVENOUS | Status: AC
  Administered 2022-01-03: 0.4 mg via INTRAVENOUS
  Filled 2022-01-03: qty 5

## 2022-01-05 ENCOUNTER — Ambulatory Visit (HOSPITAL_COMMUNITY)
Admission: RE | Admit: 2022-01-05 | Discharge: 2022-01-05 | Disposition: A | Discharge status: Home / Self Care | Payer: Medicare Other | Source: Ambulatory Visit | Attending: Internal Medicine | Admitting: Internal Medicine

## 2022-01-05 ENCOUNTER — Ambulatory Visit: Payer: Medicare Other | Admitting: Cardiology

## 2022-01-05 DIAGNOSIS — Z87891 Personal history of nicotine dependence: Secondary | ICD-10-CM | POA: Diagnosis not present

## 2022-01-05 DIAGNOSIS — Z122 Encounter for screening for malignant neoplasm of respiratory organs: Secondary | ICD-10-CM | POA: Diagnosis not present

## 2022-01-05 DIAGNOSIS — F17201 Nicotine dependence, unspecified, in remission: Secondary | ICD-10-CM | POA: Insufficient documentation

## 2022-01-05 DIAGNOSIS — J439 Emphysema, unspecified: Secondary | ICD-10-CM | POA: Diagnosis not present

## 2022-01-05 DIAGNOSIS — I700 Atherosclerosis of aorta: Secondary | ICD-10-CM | POA: Insufficient documentation

## 2022-01-07 DIAGNOSIS — Z961 Presence of intraocular lens: Secondary | ICD-10-CM | POA: Diagnosis not present

## 2022-01-07 DIAGNOSIS — E119 Type 2 diabetes mellitus without complications: Secondary | ICD-10-CM | POA: Diagnosis not present

## 2022-01-07 DIAGNOSIS — H02002 Unspecified entropion of right lower eyelid: Secondary | ICD-10-CM | POA: Diagnosis not present

## 2022-01-07 DIAGNOSIS — G4730 Sleep apnea, unspecified: Secondary | ICD-10-CM | POA: Diagnosis not present

## 2022-01-07 DIAGNOSIS — H04123 Dry eye syndrome of bilateral lacrimal glands: Secondary | ICD-10-CM | POA: Diagnosis not present

## 2022-01-14 DIAGNOSIS — R5381 Other malaise: Secondary | ICD-10-CM | POA: Diagnosis not present

## 2022-01-14 DIAGNOSIS — R519 Headache, unspecified: Secondary | ICD-10-CM | POA: Diagnosis not present

## 2022-01-14 DIAGNOSIS — E440 Moderate protein-calorie malnutrition: Secondary | ICD-10-CM | POA: Diagnosis not present

## 2022-01-14 DIAGNOSIS — E1169 Type 2 diabetes mellitus with other specified complication: Secondary | ICD-10-CM | POA: Diagnosis not present

## 2022-01-14 DIAGNOSIS — I10 Essential (primary) hypertension: Secondary | ICD-10-CM | POA: Diagnosis not present

## 2022-01-18 ENCOUNTER — Other Ambulatory Visit: Payer: Self-pay

## 2022-01-18 DIAGNOSIS — J449 Chronic obstructive pulmonary disease, unspecified: Secondary | ICD-10-CM

## 2022-01-19 ENCOUNTER — Telehealth: Payer: Self-pay | Admitting: *Deleted

## 2022-01-19 NOTE — Telephone Encounter (Signed)
   Telephone encounter was:  Successful.  01/19/2022 Name: ASLAN HIMES MRN: [id number] DOB: [birth year]  MARON STANZIONE is a 76 y.o. year old male who is a primary care patient of [place] Crane, Edwinna Areola, MD . The community resource team was consulted for assistance with [place] guide performed the following interventions: Patient provided with information about care guide support team and interviewed to confirm resource needs. patient will be mailed food banks for [employer] Follow Up Plan:  No further follow up planned at this time. The patient has been provided with needed resources.  [employer] [phone] [address] , [place] [address] Email : Ashby [email or web address]

## 2022-01-19 NOTE — Telephone Encounter (Signed)
   Telephone encounter was:  Successful.  01/19/2022 Name: Andre [name] MRN: [id number] DOB: [birth year]  Andre [name] is a 76 y.o. year old male who is a primary care patient of [place] Crane, Edwinna Areola, MD . The community resource team was consulted for assistance with [place] guide performed the following interventions: Patient provided with information about care guide support team and interviewed to confirm resource needs Follow up call placed to community resources to determine status of patients referral.  Follow Up Plan:  Care guide will follow up with patient by phone over the next days [employer] [address] , [place] [address] Email : Ashby [email or web address]. [email or web address]

## 2022-01-20 DIAGNOSIS — M79672 Pain in left foot: Secondary | ICD-10-CM | POA: Diagnosis not present

## 2022-01-20 DIAGNOSIS — M79671 Pain in right foot: Secondary | ICD-10-CM | POA: Diagnosis not present

## 2022-01-20 DIAGNOSIS — E1140 Type 2 diabetes mellitus with diabetic neuropathy, unspecified: Secondary | ICD-10-CM | POA: Diagnosis not present

## 2022-01-20 DIAGNOSIS — I739 Peripheral vascular disease, unspecified: Secondary | ICD-10-CM | POA: Diagnosis not present

## 2022-01-20 DIAGNOSIS — M79675 Pain in left toe(s): Secondary | ICD-10-CM | POA: Diagnosis not present

## 2022-01-20 DIAGNOSIS — L110 Acquired keratosis follicularis: Secondary | ICD-10-CM | POA: Diagnosis not present

## 2022-01-20 DIAGNOSIS — M79674 Pain in right toe(s): Secondary | ICD-10-CM | POA: Diagnosis not present

## 2022-01-21 ENCOUNTER — Ambulatory Visit: Payer: Medicare Other | Admitting: Cardiology

## 2022-01-25 DIAGNOSIS — E119 Type 2 diabetes mellitus without complications: Secondary | ICD-10-CM | POA: Diagnosis not present

## 2022-01-25 DIAGNOSIS — I951 Orthostatic hypotension: Secondary | ICD-10-CM | POA: Diagnosis not present

## 2022-01-25 DIAGNOSIS — I10 Essential (primary) hypertension: Secondary | ICD-10-CM | POA: Diagnosis not present

## 2022-01-25 DIAGNOSIS — M530 Cervicocranial syndrome: Secondary | ICD-10-CM | POA: Diagnosis not present

## 2022-02-18 DIAGNOSIS — Z23 Encounter for immunization: Secondary | ICD-10-CM | POA: Diagnosis not present

## 2022-02-21 DIAGNOSIS — H6123 Impacted cerumen, bilateral: Secondary | ICD-10-CM | POA: Diagnosis not present

## 2022-03-16 ENCOUNTER — Encounter: Payer: Self-pay | Admitting: Neurology

## 2022-03-16 DIAGNOSIS — I951 Orthostatic hypotension: Secondary | ICD-10-CM | POA: Diagnosis not present

## 2022-03-16 DIAGNOSIS — M530 Cervicocranial syndrome: Secondary | ICD-10-CM | POA: Diagnosis not present

## 2022-03-16 DIAGNOSIS — E119 Type 2 diabetes mellitus without complications: Secondary | ICD-10-CM | POA: Diagnosis not present

## 2022-03-16 DIAGNOSIS — I10 Essential (primary) hypertension: Secondary | ICD-10-CM | POA: Diagnosis not present

## 2022-03-24 ENCOUNTER — Telehealth: Payer: Self-pay | Admitting: Pharmacist

## 2022-03-24 DIAGNOSIS — E1169 Type 2 diabetes mellitus with other specified complication: Secondary | ICD-10-CM | POA: Diagnosis not present

## 2022-03-24 DIAGNOSIS — E782 Mixed hyperlipidemia: Secondary | ICD-10-CM | POA: Diagnosis not present

## 2022-03-24 NOTE — Progress Notes (Signed)
[place] [facility])                                            [place] Team    03/24/2022  Andre [name] [birth year] [id number]  Attempted to reach patient today to discuss 2024 re-enrollment into Januvia's PAP.  No answer, no vm.  Will tr again at a later date.  Curlene Labrum, PharmD [place] Pharmacist Office: [phone]

## 2022-03-25 ENCOUNTER — Telehealth: Payer: Self-pay | Admitting: Pharmacist

## 2022-03-25 NOTE — Progress Notes (Signed)
[place] [facility]) Care Management  [place]   03/25/2022  Andre [name] [birth year] [id number]  Reason for referral: 2024 PAP re-enrollment for Januvia  Medication Assistance Findings:  Medication assistance needs identified: Januvia  Additional medication assistance options reviewed with patient as warranted:  No other options identified  Plan: I will route patient assistance letter to [place] technician who will coordinate patient assistance program application process for medications listed above.  [facility] pharmacy technician will assist with obtaining all required documents from both patient and provider(s) and submit application(s) once completed.    Curlene Labrum, PharmD [place] Pharmacist Office: [phone]

## 2022-03-30 ENCOUNTER — Telehealth: Payer: Self-pay | Admitting: Pharmacy Technician

## 2022-03-30 DIAGNOSIS — Z596 Low income: Secondary | ICD-10-CM

## 2022-03-30 DIAGNOSIS — R945 Abnormal results of liver function studies: Secondary | ICD-10-CM | POA: Diagnosis not present

## 2022-03-30 DIAGNOSIS — G4733 Obstructive sleep apnea (adult) (pediatric): Secondary | ICD-10-CM | POA: Diagnosis not present

## 2022-03-30 DIAGNOSIS — I10 Essential (primary) hypertension: Secondary | ICD-10-CM | POA: Diagnosis not present

## 2022-03-30 DIAGNOSIS — E1169 Type 2 diabetes mellitus with other specified complication: Secondary | ICD-10-CM | POA: Diagnosis not present

## 2022-03-30 DIAGNOSIS — M5030 Other cervical disc degeneration, unspecified cervical region: Secondary | ICD-10-CM | POA: Diagnosis not present

## 2022-03-30 DIAGNOSIS — G4700 Insomnia, unspecified: Secondary | ICD-10-CM | POA: Diagnosis not present

## 2022-03-30 DIAGNOSIS — R944 Abnormal results of kidney function studies: Secondary | ICD-10-CM | POA: Diagnosis not present

## 2022-03-30 DIAGNOSIS — H04209 Unspecified epiphora, unspecified lacrimal gland: Secondary | ICD-10-CM | POA: Diagnosis not present

## 2022-03-30 DIAGNOSIS — F17201 Nicotine dependence, unspecified, in remission: Secondary | ICD-10-CM | POA: Diagnosis not present

## 2022-03-30 DIAGNOSIS — D72829 Elevated white blood cell count, unspecified: Secondary | ICD-10-CM | POA: Diagnosis not present

## 2022-03-30 DIAGNOSIS — E782 Mixed hyperlipidemia: Secondary | ICD-10-CM | POA: Diagnosis not present

## 2022-03-30 NOTE — Progress Notes (Signed)
Medication Assistance Referral-FOR 2024 RE ENROLLMENT  Referral From:  [facility] Rph Curlene Labrum  Medication/Company: [employer]esta Gentile / [employer] Patient application portion:  Mailed Provider application portion: Interoffice Mailed to Dr. Celene Squibb Provider address/fax verified via: Office website   Andre [name], [place]  [phone]

## 2022-04-15 DIAGNOSIS — G4700 Insomnia, unspecified: Secondary | ICD-10-CM | POA: Diagnosis not present

## 2022-04-15 DIAGNOSIS — R634 Abnormal weight loss: Secondary | ICD-10-CM | POA: Diagnosis not present

## 2022-04-15 DIAGNOSIS — M5030 Other cervical disc degeneration, unspecified cervical region: Secondary | ICD-10-CM | POA: Diagnosis not present

## 2022-04-15 DIAGNOSIS — R519 Headache, unspecified: Secondary | ICD-10-CM | POA: Diagnosis not present

## 2022-05-12 ENCOUNTER — Other Ambulatory Visit (HOSPITAL_COMMUNITY): Payer: Self-pay | Admitting: Family Medicine

## 2022-05-12 ENCOUNTER — Ambulatory Visit (HOSPITAL_COMMUNITY)
Admission: RE | Admit: 2022-05-12 | Discharge: 2022-05-12 | Disposition: A | Discharge status: Home / Self Care | Payer: Medicare Other | Source: Ambulatory Visit | Attending: Family Medicine | Admitting: Family Medicine

## 2022-05-12 DIAGNOSIS — J069 Acute upper respiratory infection, unspecified: Secondary | ICD-10-CM | POA: Insufficient documentation

## 2022-05-18 ENCOUNTER — Telehealth: Payer: Self-pay | Admitting: Pharmacy Technician

## 2022-05-18 DIAGNOSIS — Z596 Low income: Secondary | ICD-10-CM

## 2022-05-18 NOTE — Progress Notes (Signed)
[place] [facility])                                            [place] Team    05/18/2022  Andre [name] 14-Feb-1946 [id number]  Received both patient and provider portion(s) of patient assistance application(s) for Januvia. Mailed completed application and required documents into [employer].    [name] [name], [place]  [phone]

## 2022-05-19 DIAGNOSIS — M79674 Pain in right toe(s): Secondary | ICD-10-CM | POA: Diagnosis not present

## 2022-05-19 DIAGNOSIS — M79675 Pain in left toe(s): Secondary | ICD-10-CM | POA: Diagnosis not present

## 2022-05-19 DIAGNOSIS — M79672 Pain in left foot: Secondary | ICD-10-CM | POA: Diagnosis not present

## 2022-05-19 DIAGNOSIS — E1140 Type 2 diabetes mellitus with diabetic neuropathy, unspecified: Secondary | ICD-10-CM | POA: Diagnosis not present

## 2022-05-19 DIAGNOSIS — I739 Peripheral vascular disease, unspecified: Secondary | ICD-10-CM | POA: Diagnosis not present

## 2022-05-19 DIAGNOSIS — M79671 Pain in right foot: Secondary | ICD-10-CM | POA: Diagnosis not present

## 2022-05-19 DIAGNOSIS — L110 Acquired keratosis follicularis: Secondary | ICD-10-CM | POA: Diagnosis not present

## 2022-07-08 ENCOUNTER — Other Ambulatory Visit (HOSPITAL_COMMUNITY): Payer: Self-pay | Admitting: Family Medicine

## 2022-07-08 ENCOUNTER — Ambulatory Visit (HOSPITAL_COMMUNITY)
Admission: RE | Admit: 2022-07-08 | Discharge: 2022-07-08 | Disposition: A | Discharge status: Home / Self Care | Payer: Medicare Other | Source: Ambulatory Visit | Attending: Family Medicine | Admitting: Family Medicine

## 2022-07-08 DIAGNOSIS — R1310 Dysphagia, unspecified: Secondary | ICD-10-CM | POA: Diagnosis not present

## 2022-07-08 DIAGNOSIS — R590 Localized enlarged lymph nodes: Secondary | ICD-10-CM | POA: Insufficient documentation

## 2022-07-11 ENCOUNTER — Other Ambulatory Visit (HOSPITAL_COMMUNITY): Payer: Self-pay | Admitting: Family Medicine

## 2022-07-11 DIAGNOSIS — R590 Localized enlarged lymph nodes: Secondary | ICD-10-CM

## 2022-07-21 DIAGNOSIS — H04332 Acute lacrimal canaliculitis of left lacrimal passage: Secondary | ICD-10-CM | POA: Diagnosis not present

## 2022-07-21 DIAGNOSIS — H0102B Squamous blepharitis left eye, upper and lower eyelids: Secondary | ICD-10-CM | POA: Diagnosis not present

## 2022-07-23 ENCOUNTER — Ambulatory Visit (HOSPITAL_BASED_OUTPATIENT_CLINIC_OR_DEPARTMENT_OTHER)
Admission: RE | Admit: 2022-07-23 | Discharge: 2022-07-23 | Disposition: A | Discharge status: Home / Self Care | Payer: Medicare Other | Source: Ambulatory Visit | Attending: Family Medicine | Admitting: Family Medicine

## 2022-07-23 DIAGNOSIS — R1310 Dysphagia, unspecified: Secondary | ICD-10-CM | POA: Diagnosis not present

## 2022-07-23 DIAGNOSIS — R590 Localized enlarged lymph nodes: Secondary | ICD-10-CM | POA: Diagnosis not present

## 2022-07-23 MED ORDER — IOHEXOL 300 MG/ML  SOLN
75.0000 mL | Freq: Once | INTRAMUSCULAR | Status: AC | PRN
  Administered 2022-07-23: 75 mL via INTRAVENOUS

## 2022-07-27 DIAGNOSIS — E782 Mixed hyperlipidemia: Secondary | ICD-10-CM | POA: Diagnosis not present

## 2022-07-27 DIAGNOSIS — E1169 Type 2 diabetes mellitus with other specified complication: Secondary | ICD-10-CM | POA: Diagnosis not present

## 2022-07-28 DIAGNOSIS — H04332 Acute lacrimal canaliculitis of left lacrimal passage: Secondary | ICD-10-CM | POA: Diagnosis not present

## 2022-07-28 DIAGNOSIS — H0102B Squamous blepharitis left eye, upper and lower eyelids: Secondary | ICD-10-CM | POA: Diagnosis not present

## 2022-07-29 ENCOUNTER — Other Ambulatory Visit (HOSPITAL_COMMUNITY): Payer: Self-pay | Admitting: Occupational Therapy

## 2022-07-29 DIAGNOSIS — K219 Gastro-esophageal reflux disease without esophagitis: Secondary | ICD-10-CM

## 2022-07-29 DIAGNOSIS — R1312 Dysphagia, oropharyngeal phase: Secondary | ICD-10-CM

## 2022-07-31 DIAGNOSIS — I700 Atherosclerosis of aorta: Secondary | ICD-10-CM | POA: Insufficient documentation

## 2022-08-01 DIAGNOSIS — G4700 Insomnia, unspecified: Secondary | ICD-10-CM | POA: Diagnosis not present

## 2022-08-01 DIAGNOSIS — R5383 Other fatigue: Secondary | ICD-10-CM | POA: Insufficient documentation

## 2022-08-01 DIAGNOSIS — G4733 Obstructive sleep apnea (adult) (pediatric): Secondary | ICD-10-CM | POA: Diagnosis not present

## 2022-08-01 DIAGNOSIS — I700 Atherosclerosis of aorta: Secondary | ICD-10-CM | POA: Diagnosis not present

## 2022-08-01 DIAGNOSIS — R519 Headache, unspecified: Secondary | ICD-10-CM | POA: Diagnosis not present

## 2022-08-01 DIAGNOSIS — M5030 Other cervical disc degeneration, unspecified cervical region: Secondary | ICD-10-CM | POA: Diagnosis not present

## 2022-08-01 DIAGNOSIS — D72829 Elevated white blood cell count, unspecified: Secondary | ICD-10-CM | POA: Diagnosis not present

## 2022-08-01 DIAGNOSIS — J449 Chronic obstructive pulmonary disease, unspecified: Secondary | ICD-10-CM | POA: Diagnosis not present

## 2022-08-01 DIAGNOSIS — R634 Abnormal weight loss: Secondary | ICD-10-CM | POA: Diagnosis not present

## 2022-08-05 DIAGNOSIS — H04322 Acute dacryocystitis of left lacrimal passage: Secondary | ICD-10-CM | POA: Diagnosis not present

## 2022-08-08 ENCOUNTER — Ambulatory Visit: Payer: Medicare Other | Admitting: Neurology

## 2022-08-08 ENCOUNTER — Encounter: Payer: Self-pay | Admitting: Neurology

## 2022-08-08 VITALS — BP 115/69 | HR 100 | Ht 73.0 in | Wt 204.0 lb

## 2022-08-08 DIAGNOSIS — X58XXXA Exposure to other specified factors, initial encounter: Secondary | ICD-10-CM | POA: Diagnosis not present

## 2022-08-08 DIAGNOSIS — M47812 Spondylosis without myelopathy or radiculopathy, cervical region: Secondary | ICD-10-CM | POA: Diagnosis not present

## 2022-08-08 DIAGNOSIS — S91102A Unspecified open wound of left great toe without damage to nail, initial encounter: Secondary | ICD-10-CM | POA: Diagnosis not present

## 2022-08-08 DIAGNOSIS — I951 Orthostatic hypotension: Secondary | ICD-10-CM | POA: Diagnosis not present

## 2022-08-08 DIAGNOSIS — G4486 Cervicogenic headache: Secondary | ICD-10-CM

## 2022-08-08 MED ORDER — DULOXETINE HCL 30 MG PO CPEP: 30.0000 mg | ORAL_CAPSULE | Freq: Every day | ORAL | 5 refills | Status: AC

## 2022-08-08 MED ORDER — TIZANIDINE HCL 2 MG PO TABS: 2.0000 mg | ORAL_TABLET | Freq: Every day | ORAL | 5 refills | Status: DC

## 2022-08-08 NOTE — Patient Instructions (Signed)
Continue duloxetine '30mg'$  daily Start tizanidine '2mg'$  at bedtime.  If no improvement in headaches in 2 weeks, contact me and we can increase dose Unfortunately,  I do not prescribe midodrine.  I would address refills with Dr. Nevada Crane Follow up 4 to 5 months.

## 2022-08-08 NOTE — Progress Notes (Signed)
NEUROLOGY CONSULTATION NOTE  Andre [name] MRN: [id number] DOB: 11-02-1945  Referring provider: Phillips Odor, MD Primary care provider: Allyn Kenner, MD  Reason for consult:  headache  Assessment/Plan:   Cervicogenic headache/migraines, not intractable Orthostatic hypotension - I have told him that I do not treat this condition.  Defer to PCP regarding ongoing management.    Continue duloxetine '30mg'$  daily Add tizanidine '2mg'$  at bedtime to further try to reduce headache frequency.  We can increase dose in 2 weeks if needed. Limit use of pain relievers to no more than 2 days out of week to prevent risk of rebound or medication-overuse headache. Follow up in 4-5 months.   Subjective:  Andre [name] is a 77 year old male with HTN, HLD, sleep apnea, DM II, orthostatic hypotension, hiatal hernia, GERD and history of TIA who presents for headaches.  History supplemented by his accompanying friend and referring provider's note.  Onset:  2023 Location:  across back of head radiating into neck Quality:  throbbing Intensity:  used to be severe, now mild.   Aura:  sometimes sees wiggly lines Prodrome:  absent Associated symptoms:  Photophobia.  Rarely nausea.  He denies associated vomiting, phonophobia, unilateral numbness or weakness. Duration:  originally 2-3 days, b Frequency:  5 days a week.  Treats with Extra-strength Tylenol.  She does have remote history of migraines.   Imaging personally reviewed: 08/09/2021 CT HEAD WO:  No evidence of acute intracranial abnormality.  Mild generalized cerebral atrophy.  Minimal paranasal sinus mucosal thickening. 10/06/2020 MRI C-SPINE WO:  Multilevel degenerative change in the cervical spine.  Foraminal narrowing most severe on the left at C3-4 and C4-5.  Past NSAIDS/analgesics:  ibuprofen Past abortive triptans:  none Past abortive ergotamine:  none Past muscle relaxants:  Flexeril, baclofen Past anti-emetic:  none Past  antihypertensive medications:  none Past antidepressant medications:  sertraline Past anticonvulsant medications:  gabapentin Past anti-CGRP:  none   Current NSAIDS/analgesics:  celecoxib, tramadol '50mg'$  Current triptans:  none Current ergotamine:  none Current anti-emetic:  none Current muscle relaxants:  none Current Antihypertensive medications:  losartan Current Antidepressant medications:  duloxetine '30mg'$  daily (unable to tolerate '60mg'$ ) Current Anticonvulsant medications:  topiramate '50mg'$  QD Current anti-CGRP:  none Current Vitamins/Herbal/Supplements:  none Current Antihistamines/Decongestants:  none Other therapy:  none Other medications:  diazepam '5mg'$ , midodrine '5mg'$  TID, meformin   PAST MEDICAL HISTORY: Past Medical History:  Diagnosis Date   Adenomatous polyp 2002   tcs by [name]   Diabetes mellitus without complication [facility])    Diverticula of colon 2002   L side   GERD (gastroesophageal reflux disease)    Hemorrhoids 2002   internal and external   Hiatal hernia 2002   moderate   Hyperlipidemia    Hypertension    Mini stroke 1998   no deficits   Sleep apnea    does not use CPAP. PCP aware.    PAST SURGICAL HISTORY: Past Surgical History:  Procedure Laterality Date   CATARACT EXTRACTION W/PHACO Left 12/31/2015   Procedure: CATARACT EXTRACTION PHACO AND INTRAOCULAR LENS PLACEMENT LEFT EYE; CDE: 17.28;  Surgeon: Andre Branch, MD;  Location: [facility];  Service: Ophthalmology;  Laterality: Left;   CHOLECYSTECTOMY  1990   [name]   COLONOSCOPY  01/23/01   [name]- small internal and external hemorrhoids, L sided mild diverticula. adenomatous polyp removed from rectum.   COLONOSCOPY  12/26/2011   Procedure: COLONOSCOPY;  Surgeon: [name], MD;  Location[facility];  Service: Endoscopy;  Laterality: N/A;  8:30   ESOPHAGOGASTRODUODENOSCOPY  01/23/01   Dr. 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      atorvastatin (LIPITOR) 40 MG tablet Take 40 mg by mouth daily.     celecoxib (CELEBREX) 200 MG capsule Take 200 mg by mouth daily.     DULoxetine (CYMBALTA) 30 MG capsule Take 30 mg by mouth daily.     JANUVIA 100 MG tablet Take 100 mg by mouth daily.     losartan (COZAAR) 25 MG tablet Take 25 mg by mouth daily.     metFORMIN (GLUCOPHAGE) 500 MG tablet Take 500 mg by mouth 2 (two) times daily.     omeprazole (PRILOSEC) 20 MG capsule TAKE 1 CAPSULE BY MOUTH ONCE A DAY. 30 capsule 0   traMADol (ULTRAM) 50 MG tablet Take 1 tablet (50 mg total) by mouth every 6 (six) hours as needed. 20 tablet 0   No current facility-administered medications on file prior to visit.    ALLERGIES: No Known Allergies  FAMILY HISTORY: Family History  Problem Relation Age of Onset   Hyperlipidemia Mother    Heart disease Mother    Hypertension Mother    Stroke Mother    Heart disease Father        MI   Colon cancer Neg Hx     Objective:  Blood pressure 115/69, pulse 100, height '6\' 1"'$  (1.854 m), weight 204 lb (92.5 kg), SpO2 94 %. General: No acute distress.  Patient appears well-groomed.   Head:  Normocephalic/atraumatic Eyes:  fundi examined but not visualized Neck: supple, no paraspinal tenderness, full range of motion Back: No paraspinal tenderness Heart: regular rate and rhythm Lungs: Clear to auscultation bilaterally. Vascular: No carotid bruits. Neurological Exam: Mental status: alert and oriented to person, place, and time, speech fluent and not dysarthric, language intact. Cranial nerves: CN I: not tested CN II: pupils equal, round and reactive to light, visual fields intact CN III, IV, VI:  full range of motion, no nystagmus, no ptosis CN V: facial sensation intact. CN VII:  upper and lower face symmetric CN VIII: hearing intact CN IX, X: gag intact, uvula midline CN XI: sternocleidomastoid and trapezius muscles intact CN XII: tongue midline Bulk & Tone: normal, no fasciculations. Motor:  muscle strength 5/5 throughout Sensation:  Pinprick sensation reduced in left foot.  Vibratory sensation reduced in both feet. Deep Tendon Reflexes:  1+ throughout,  toes downgoing.   Finger to nose testing:  Without dysmetria.   Heel to shin:  Without dysmetria.   Gait:  Antalgic gait.  Ambulates with walker.      Thank you for allowing me to take part in the care of this patient.  Metta Clines, DO  CC: Allyn Kenner, MD

## 2022-08-09 ENCOUNTER — Ambulatory Visit (HOSPITAL_COMMUNITY)
Admission: RE | Admit: 2022-08-09 | Discharge: 2022-08-09 | Disposition: A | Discharge status: Home / Self Care | Payer: Medicare Other | Source: Ambulatory Visit | Attending: Family Medicine | Admitting: Family Medicine

## 2022-08-09 ENCOUNTER — Ambulatory Visit (HOSPITAL_COMMUNITY): Payer: Medicare Other | Attending: Family Medicine | Admitting: Speech Pathology

## 2022-08-09 ENCOUNTER — Encounter (HOSPITAL_COMMUNITY): Payer: Self-pay | Admitting: Speech Pathology

## 2022-08-09 DIAGNOSIS — E1140 Type 2 diabetes mellitus with diabetic neuropathy, unspecified: Secondary | ICD-10-CM | POA: Diagnosis not present

## 2022-08-09 DIAGNOSIS — L89893 Pressure ulcer of other site, stage 3: Secondary | ICD-10-CM | POA: Diagnosis not present

## 2022-08-09 DIAGNOSIS — R1312 Dysphagia, oropharyngeal phase: Secondary | ICD-10-CM | POA: Insufficient documentation

## 2022-08-09 DIAGNOSIS — M79675 Pain in left toe(s): Secondary | ICD-10-CM | POA: Diagnosis not present

## 2022-08-09 DIAGNOSIS — K219 Gastro-esophageal reflux disease without esophagitis: Secondary | ICD-10-CM | POA: Diagnosis not present

## 2022-08-09 DIAGNOSIS — M79672 Pain in left foot: Secondary | ICD-10-CM | POA: Diagnosis not present

## 2022-08-09 DIAGNOSIS — I739 Peripheral vascular disease, unspecified: Secondary | ICD-10-CM | POA: Diagnosis not present

## 2022-08-10 NOTE — Therapy (Signed)
[facility] at [place], [place], [address] Phone: [phone]   Fax:  [phone]  Modified Barium Swallow  Patient Details  Name: Andre [name] MRN: [id number] Date of Birth: Jan 16, 1946 No data recorded  Encounter Date: 08/09/2022   End of Session - 08/09/22 1713     Visit Number 1    Number of Visits 1    Authorization Type UHC Medicare    SLP Start Time [id number]    SLP Stop Time  [id number]    SLP Time Calculation (min) 27 min    Activity Tolerance Patient tolerated treatment well             Past Medical History:  Diagnosis Date   Adenomatous polyp 2002   tcs by [name]   Diabetes mellitus without complication [facility])    Diverticula of colon 2002   L side   GERD (gastroesophageal reflux disease)    Hemorrhoids 2002   internal and external   Hiatal hernia 2002   moderate   Hyperlipidemia    Hypertension    Mini stroke 1998   no deficits   Sleep apnea    does not use CPAP. PCP aware.    Past Surgical History:  Procedure Laterality Date   CATARACT EXTRACTION W/PHACO Left 12/31/2015   Procedure: CATARACT EXTRACTION PHACO AND INTRAOCULAR LENS PLACEMENT LEFT EYE; CDE: 17.28;  Surgeon: [name], MD;  Location: [facility];  Service: Ophthalmology;  Laterality: Left;   CHOLECYSTECTOMY  1990   [name]   COLONOSCOPY  01/23/01   [name]- small internal and external hemorrhoids, L sided mild diverticula. adenomatous polyp removed from rectum.   COLONOSCOPY  12/26/2011   Procedure: COLONOSCOPY;  Surgeon: [name], MD;  Location[facility];  Service: Endoscopy;  Laterality: N/A;  8:30   ESOPHAGOGASTRODUODENOSCOPY  01/23/01   Dr. 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        General - 08/09/22 1709       General Information   Date of  Onset 07/11/22    HPI [name] is a 77 yo male who was referred for MBSS by Valentino Nose, FNP due to Pt reports of dysphagia to solids and ok with liquids.    Type of Study MBS-Modified Barium Swallow Study    Diet Prior to this Study Regular;Thin liquids (Level 0)    Temperature Spikes Noted No    Respiratory Status Room air    History of Recent Intubation No    Behavior/Cognition Alert;Cooperative;Pleasant mood    Oral Cavity: Oral Hygiene Within Functional Limits    Oral Care Completed by SLP No    Oral Cavity - Dentition Edentulous    Vision Functional for self feeding    Self-Feeding Abilities Able to feed self    Patient Positioning Upright in chair    Baseline Vocal Quality Normal    Volitional Cough Strong    Volitional Swallow Able to elicit    Anatomy Within functional limits    Pharyngeal Secretions Not observed secondary MBS                Oral Preparation/Oral Phase - 08/09/22 1714       Oral Preparation/Oral Phase   Oral Phase Impaired  Oral - Solids   Oral - Regular Delayed A-P transit;Piecemeal swallowing;Oral residue              Pharyngeal Phase - 08/09/22 1715       Pharyngeal Phase   Pharyngeal Phase Impaired      Pharyngeal - Honey   Pharyngeal- Honey Teaspoon Reduced tongue base retraction      Pharyngeal - Nectar   Pharyngeal- Nectar Teaspoon Reduced tongue base retraction    Pharyngeal- Nectar Cup Reduced tongue base retraction      Pharyngeal - Thin   Pharyngeal- Thin Teaspoon Reduced tongue base retraction;Reduced airway/laryngeal closure;Penetration/Aspiration during swallow    Pharyngeal Material enters airway, remains ABOVE vocal cords then ejected out    Pharyngeal- Thin Cup Reduced tongue base retraction;Pharyngeal residue - valleculae;Penetration/Aspiration during swallow    Pharyngeal Material enters airway, remains ABOVE vocal cords then ejected out      Pharyngeal - Solids   Pharyngeal- Puree Reduced tongue  base retraction    Pharyngeal- Regular Reduced tongue base retraction;Pharyngeal residue - valleculae      Electrical Stimulation - Pharyngeal Phase   Was Electrical Stimulation Used No              Cricopharyngeal Phase - 08/09/22 1715       Cervical Esophageal Phase   Cervical Esophageal Phase Impaired      Cervical Esophageal Phase - Thin   Thin Cup Prominent cricopharyngeal segment                      Plan - 08/09/22 1714     Clinical Impression Statement Pt presents with mild oropharyngeal dysphagia characterized by mild oral phase in setting of edentulous status (Pt has partials but does not wear for meals) with impaired mastication and weak lingual movement resulting in prolonged oral transit with piecemeal deglutition of solids, timely swallow trigger with flash penetration of thins during the swallow without aspiration. Pt with reduced tongue retraction resulting in min vallecular residue which clears with repeat swallow. Recommend self regulated regular textures with thin liquids and Pt to chop up meats well. He reports difficulty with solid foods and I suspect that this is due to xerostomia and poor dentition so he is not masticating solids well. This study was reviewed with Pt and friend and written recommendations were provided.    Consulted and Agree with Plan of Care Patient             Patient will benefit from skilled therapeutic intervention in order to improve the following deficits and impairments:   Dysphagia, oropharyngeal phase     Recommendations/Treatment - 08/09/22 1716       Swallow Evaluation Recommendations   SLP Diet Recommendations Age appropriate regular;Thin   chopped meats   Liquid Administration via Cup    Medication Administration Whole meds with liquid    Supervision Patient able to self feed    Compensations Small sips/bites    Postural Changes Seated upright at 90 degrees;Remain upright for at least 30 minutes after  feeds/meals              Prognosis - 08/09/22 1716       Prognosis   Prognosis for improved oropharyngeal function Good      Individuals Consulted   Consulted and Agree with Results and Recommendations Patient    Report Sent to  Referring physician             Problem List Patient Active Problem  List   Diagnosis Date Noted   Leukocytosis 10/01/2019   Diarrhea in adult patient 10/01/2019   SBO (small bowel obstruction) ([place]) 09/28/2019   AKI (acute kidney injury) ([place]) 09/28/2019   Dehydration 09/28/2019   Emesis, persistent 09/28/2019   Memory change 11/01/2012   OSA (obstructive sleep apnea) 05/02/2012   Hx of adenomatous colonic polyps 12/02/2011   Routine general medical examination at a health care facility 11/04/2011   Decreased visual acuity 11/04/2011   Hyperlipidemia 11/04/2011   Tobacco use 09/21/2011   History of TIAs 09/20/2011   COPD (chronic obstructive pulmonary disease) ([place]) 09/20/2011   Insomnia 09/20/2011   Colon polyp 09/20/2011   Thank you,  Genene Churn, [place]  Bradie Lacock, [place] 08/09/2022, 4:21 PM  [facility] [place], [place], [address] Phone: [phone]   Fax:  [phone]  Name: Andre [name] MRN: [id number] Date of Birth: 01/20/1946

## 2022-08-11 DIAGNOSIS — H04202 Unspecified epiphora, left lacrimal gland: Secondary | ICD-10-CM | POA: Diagnosis not present

## 2022-08-17 ENCOUNTER — Telehealth: Payer: Self-pay | Admitting: Pharmacy Technician

## 2022-08-17 DIAGNOSIS — Z596 Low income: Secondary | ICD-10-CM

## 2022-08-17 NOTE — Progress Notes (Signed)
[facility])                                            [place] Team    08/17/2022  MACE MORRISETTE 07-02-1945 [id number]  Care coordination call placed to [employer] in regard to [facility] application.  Spoke to [place] who informs patient has been APPROVED 08/15/22-06/06/23. Medication will be delivered to the patient's home. Subsequent refills will need to be called into [employer] at [phone] by the patient when he has around 2 week supply remaining.  Kathleen Likins P. Keyshun Elpers, [place]  [phone]

## 2022-08-24 DIAGNOSIS — Z87891 Personal history of nicotine dependence: Secondary | ICD-10-CM | POA: Diagnosis not present

## 2022-08-24 DIAGNOSIS — J069 Acute upper respiratory infection, unspecified: Secondary | ICD-10-CM | POA: Diagnosis not present

## 2022-08-25 DIAGNOSIS — M79672 Pain in left foot: Secondary | ICD-10-CM | POA: Diagnosis not present

## 2022-08-25 DIAGNOSIS — L89892 Pressure ulcer of other site, stage 2: Secondary | ICD-10-CM | POA: Diagnosis not present

## 2022-08-25 DIAGNOSIS — M79675 Pain in left toe(s): Secondary | ICD-10-CM | POA: Diagnosis not present

## 2022-08-25 DIAGNOSIS — E1140 Type 2 diabetes mellitus with diabetic neuropathy, unspecified: Secondary | ICD-10-CM | POA: Diagnosis not present

## 2022-08-25 DIAGNOSIS — L110 Acquired keratosis follicularis: Secondary | ICD-10-CM | POA: Diagnosis not present

## 2022-09-22 DIAGNOSIS — M79671 Pain in right foot: Secondary | ICD-10-CM | POA: Diagnosis not present

## 2022-09-22 DIAGNOSIS — E1140 Type 2 diabetes mellitus with diabetic neuropathy, unspecified: Secondary | ICD-10-CM | POA: Diagnosis not present

## 2022-09-22 DIAGNOSIS — I739 Peripheral vascular disease, unspecified: Secondary | ICD-10-CM | POA: Diagnosis not present

## 2022-09-22 DIAGNOSIS — M79675 Pain in left toe(s): Secondary | ICD-10-CM | POA: Diagnosis not present

## 2022-09-22 DIAGNOSIS — M79672 Pain in left foot: Secondary | ICD-10-CM | POA: Diagnosis not present

## 2022-09-22 DIAGNOSIS — M79674 Pain in right toe(s): Secondary | ICD-10-CM | POA: Diagnosis not present

## 2022-09-22 DIAGNOSIS — L110 Acquired keratosis follicularis: Secondary | ICD-10-CM | POA: Diagnosis not present

## 2022-10-07 DIAGNOSIS — Z01818 Encounter for other preprocedural examination: Secondary | ICD-10-CM | POA: Diagnosis not present

## 2022-10-07 DIAGNOSIS — E119 Type 2 diabetes mellitus without complications: Secondary | ICD-10-CM | POA: Diagnosis not present

## 2022-10-07 DIAGNOSIS — H04551 Acquired stenosis of right nasolacrimal duct: Secondary | ICD-10-CM | POA: Diagnosis not present

## 2022-10-12 ENCOUNTER — Telehealth: Payer: Self-pay | Admitting: Neurology

## 2022-10-12 NOTE — Telephone Encounter (Signed)
Received a fax for surgery clearance from [facility]. Form is in Dr. Moises Blood box

## 2022-10-14 NOTE — Telephone Encounter (Signed)
[facility] called in to double check on the surgery clearance. She stated the pt would have to start holding the asprin this Monday 10/17/22 if Dr. Everlena Cooper ok's the surgery.

## 2022-10-17 NOTE — Telephone Encounter (Signed)
Advised Rep of Dr.Jaffe note,  I saw this gentleman for headache.  I have no comment on holding aspirin

## 2022-10-20 DIAGNOSIS — H04201 Unspecified epiphora, right lacrimal gland: Secondary | ICD-10-CM | POA: Diagnosis not present

## 2022-10-27 DIAGNOSIS — H04412 Chronic dacryocystitis of left lacrimal passage: Secondary | ICD-10-CM | POA: Diagnosis not present

## 2022-10-27 DIAGNOSIS — H04222 Epiphora due to insufficient drainage, left lacrimal gland: Secondary | ICD-10-CM | POA: Diagnosis not present

## 2022-10-27 DIAGNOSIS — H04322 Acute dacryocystitis of left lacrimal passage: Secondary | ICD-10-CM | POA: Diagnosis not present

## 2022-10-27 DIAGNOSIS — H04552 Acquired stenosis of left nasolacrimal duct: Secondary | ICD-10-CM | POA: Diagnosis not present

## 2022-10-27 DIAGNOSIS — H04542 Stenosis of left lacrimal canaliculi: Secondary | ICD-10-CM | POA: Diagnosis not present

## 2022-10-27 DIAGNOSIS — H02035 Senile entropion of left lower eyelid: Secondary | ICD-10-CM | POA: Diagnosis not present

## 2022-11-10 DIAGNOSIS — H35372 Puckering of macula, left eye: Secondary | ICD-10-CM | POA: Diagnosis not present

## 2022-11-10 DIAGNOSIS — Z09 Encounter for follow-up examination after completed treatment for conditions other than malignant neoplasm: Secondary | ICD-10-CM | POA: Diagnosis not present

## 2022-11-10 DIAGNOSIS — H35732 Hemorrhagic detachment of retinal pigment epithelium, left eye: Secondary | ICD-10-CM | POA: Diagnosis not present

## 2022-11-11 DIAGNOSIS — H35372 Puckering of macula, left eye: Secondary | ICD-10-CM | POA: Diagnosis not present

## 2022-11-11 DIAGNOSIS — H43812 Vitreous degeneration, left eye: Secondary | ICD-10-CM | POA: Diagnosis not present

## 2022-11-22 ENCOUNTER — Other Ambulatory Visit: Payer: Self-pay | Admitting: Neurology

## 2022-11-23 DIAGNOSIS — E782 Mixed hyperlipidemia: Secondary | ICD-10-CM | POA: Diagnosis not present

## 2022-11-23 DIAGNOSIS — E1169 Type 2 diabetes mellitus with other specified complication: Secondary | ICD-10-CM | POA: Diagnosis not present

## 2022-11-23 DIAGNOSIS — R5383 Other fatigue: Secondary | ICD-10-CM | POA: Diagnosis not present

## 2022-11-28 DIAGNOSIS — D485 Neoplasm of uncertain behavior of skin: Secondary | ICD-10-CM | POA: Diagnosis not present

## 2022-11-28 DIAGNOSIS — L570 Actinic keratosis: Secondary | ICD-10-CM | POA: Diagnosis not present

## 2022-12-05 DIAGNOSIS — G4733 Obstructive sleep apnea (adult) (pediatric): Secondary | ICD-10-CM | POA: Diagnosis not present

## 2022-12-05 DIAGNOSIS — D72829 Elevated white blood cell count, unspecified: Secondary | ICD-10-CM | POA: Diagnosis not present

## 2022-12-05 DIAGNOSIS — R944 Abnormal results of kidney function studies: Secondary | ICD-10-CM | POA: Diagnosis not present

## 2022-12-05 DIAGNOSIS — E118 Type 2 diabetes mellitus with unspecified complications: Secondary | ICD-10-CM | POA: Insufficient documentation

## 2022-12-05 DIAGNOSIS — M5030 Other cervical disc degeneration, unspecified cervical region: Secondary | ICD-10-CM | POA: Diagnosis not present

## 2022-12-05 DIAGNOSIS — I9589 Other hypotension: Secondary | ICD-10-CM | POA: Insufficient documentation

## 2022-12-05 DIAGNOSIS — G4700 Insomnia, unspecified: Secondary | ICD-10-CM | POA: Diagnosis not present

## 2022-12-05 DIAGNOSIS — I10 Essential (primary) hypertension: Secondary | ICD-10-CM | POA: Diagnosis not present

## 2022-12-05 DIAGNOSIS — I700 Atherosclerosis of aorta: Secondary | ICD-10-CM | POA: Diagnosis not present

## 2022-12-05 DIAGNOSIS — E1169 Type 2 diabetes mellitus with other specified complication: Secondary | ICD-10-CM | POA: Diagnosis not present

## 2022-12-05 DIAGNOSIS — R519 Headache, unspecified: Secondary | ICD-10-CM | POA: Diagnosis not present

## 2022-12-05 DIAGNOSIS — E782 Mixed hyperlipidemia: Secondary | ICD-10-CM | POA: Diagnosis not present

## 2022-12-05 DIAGNOSIS — R634 Abnormal weight loss: Secondary | ICD-10-CM | POA: Diagnosis not present

## 2022-12-06 DIAGNOSIS — H04322 Acute dacryocystitis of left lacrimal passage: Secondary | ICD-10-CM | POA: Diagnosis not present

## 2022-12-06 DIAGNOSIS — H04551 Acquired stenosis of right nasolacrimal duct: Secondary | ICD-10-CM | POA: Diagnosis not present

## 2022-12-23 DIAGNOSIS — H35372 Puckering of macula, left eye: Secondary | ICD-10-CM | POA: Diagnosis not present

## 2022-12-23 DIAGNOSIS — H43812 Vitreous degeneration, left eye: Secondary | ICD-10-CM | POA: Diagnosis not present

## 2023-01-04 ENCOUNTER — Ambulatory Visit (HOSPITAL_COMMUNITY)
Admission: RE | Admit: 2023-01-04 | Discharge: 2023-01-04 | Disposition: A | Discharge status: Home / Self Care | Payer: Medicare Other | Source: Ambulatory Visit | Attending: Family Medicine | Admitting: Family Medicine

## 2023-01-04 ENCOUNTER — Other Ambulatory Visit (HOSPITAL_COMMUNITY): Payer: Self-pay | Admitting: Family Medicine

## 2023-01-04 DIAGNOSIS — M25512 Pain in left shoulder: Secondary | ICD-10-CM | POA: Diagnosis not present

## 2023-01-04 DIAGNOSIS — R52 Pain, unspecified: Secondary | ICD-10-CM

## 2023-01-04 DIAGNOSIS — M25572 Pain in left ankle and joints of left foot: Secondary | ICD-10-CM | POA: Insufficient documentation

## 2023-01-04 DIAGNOSIS — R6889 Other general symptoms and signs: Secondary | ICD-10-CM | POA: Diagnosis not present

## 2023-01-04 DIAGNOSIS — R208 Other disturbances of skin sensation: Secondary | ICD-10-CM | POA: Diagnosis not present

## 2023-01-19 DIAGNOSIS — E1140 Type 2 diabetes mellitus with diabetic neuropathy, unspecified: Secondary | ICD-10-CM | POA: Diagnosis not present

## 2023-01-19 DIAGNOSIS — M79674 Pain in right toe(s): Secondary | ICD-10-CM | POA: Diagnosis not present

## 2023-01-19 DIAGNOSIS — I739 Peripheral vascular disease, unspecified: Secondary | ICD-10-CM | POA: Diagnosis not present

## 2023-01-19 DIAGNOSIS — L110 Acquired keratosis follicularis: Secondary | ICD-10-CM | POA: Diagnosis not present

## 2023-01-19 DIAGNOSIS — M79675 Pain in left toe(s): Secondary | ICD-10-CM | POA: Diagnosis not present

## 2023-01-19 DIAGNOSIS — M79672 Pain in left foot: Secondary | ICD-10-CM | POA: Diagnosis not present

## 2023-01-19 DIAGNOSIS — M79671 Pain in right foot: Secondary | ICD-10-CM | POA: Diagnosis not present

## 2023-01-23 NOTE — Progress Notes (Deleted)
NEUROLOGY FOLLOW UP OFFICE NOTE  Andre [name] [id number]  Assessment/Plan:   Cervicogenic headache/migraines, not intractable Orthostatic hypotension - I have told him that I do not treat this condition.  Defer to PCP regarding ongoing management.       Duloxetine 30mg  daily Add tizanidine 2mg  at bedtime *** Limit use of pain relievers to no more than 2 days out of week to prevent risk of rebound or medication-overuse headache. Follow up in ***  Subjective:  Andre [name] is a 77 year old male with HTN, HLD, sleep apnea, DM II, orthostatic hypotension, hiatal hernia, GERD and history of TIA who follows up for headaches.  UPDATE: Intensity:  *** Duration:  *** Frequency:  *** Frequency of abortive medication: *** Current NSAIDS/analgesics:  celecoxib, tramadol 50mg  Current triptans:  none Current ergotamine:  none Current anti-emetic:  none Current muscle relaxants:  tizanidine 2mg  at bedtime Current Antihypertensive medications:  losartan Current Antidepressant medications:  duloxetine 30mg  daily (unable to tolerate 60mg ) Current Anticonvulsant medications:  topiramate 50mg  QD Current anti-CGRP:  none Current Vitamins/Herbal/Supplements:  none Current Antihistamines/Decongestants:  none Other therapy:  none Other medications:  diazepam 5mg , midodrine 5mg  TID, meformin  HISTORY:  Onset:  2023 Location:  across back of head radiating into neck Quality:  throbbing Intensity:  used to be severe, now mild.   Aura:  sometimes sees wiggly lines Prodrome:  absent Associated symptoms:  Photophobia.  Rarely nausea.  He denies associated vomiting, phonophobia, unilateral numbness or weakness. Duration:  originally 2-3 days, b Frequency:  5 days a week.  Treats with Extra-strength Tylenol.   She does have remote history of migraines.     Imaging personally reviewed: 08/09/2021 CT HEAD WO:  No evidence of acute intracranial abnormality.  Mild generalized cerebral  atrophy.  Minimal paranasal sinus mucosal thickening. 10/06/2020 MRI C-SPINE WO:  Multilevel degenerative change in the cervical spine.  Foraminal narrowing most severe on the left at C3-4 and C4-5.   Past NSAIDS/analgesics:  ibuprofen Past abortive triptans:  none Past abortive ergotamine:  none Past muscle relaxants:  Flexeril, baclofen Past anti-emetic:  none Past antihypertensive medications:  none Past antidepressant medications:  sertraline Past anticonvulsant medications:  gabapentin Past anti-CGRP:  none       PAST MEDICAL HISTORY: Past Medical History:  Diagnosis Date   Adenomatous polyp 2002   tcs by Dr. Ewing Schlein   Diabetes mellitus without complication [facility])    Diverticula of colon 2002   L side   GERD (gastroesophageal reflux disease)    Hemorrhoids 2002   internal and external   Hiatal hernia 2002   moderate   Hyperlipidemia    Hypertension    Mini stroke 1998   no deficits   Sleep apnea    does not use CPAP. PCP aware.    MEDICATIONS: Current Outpatient Medications on File Prior to Visit  Medication Sig Dispense Refill   aspirin 325 MG tablet Take 325 mg by mouth daily.  (Patient not taking: Reported on 08/08/2022)     atorvastatin (LIPITOR) 40 MG tablet Take 40 mg by mouth daily.     celecoxib (CELEBREX) 200 MG capsule Take 200 mg by mouth daily.     diazepam (VALIUM) 5 MG tablet Take 5 mg by mouth as needed.     DULoxetine (CYMBALTA) 30 MG capsule Take 1 capsule (30 mg total) by mouth daily. 30 capsule 5   erythromycin ophthalmic ointment 1 Application at bedtime.     JANUVIA 100 MG  tablet Take 100 mg by mouth daily.     losartan (COZAAR) 25 MG tablet Take 25 mg by mouth daily.     metFORMIN (GLUCOPHAGE) 500 MG tablet Take 500 mg by mouth 2 (two) times daily.     midodrine (PROAMATINE) 5 MG tablet Take 5 mg by mouth 3 (three) times daily.     omeprazole (PRILOSEC) 20 MG capsule TAKE 1 CAPSULE BY MOUTH ONCE A DAY. 30 capsule 0   tiZANidine (ZANAFLEX) 2 MG  tablet take 1 tablet by mouth at bedtime. 30 tablet 2   traMADol (ULTRAM) 50 MG tablet Take 1 tablet (50 mg total) by mouth every 6 (six) hours as needed. 20 tablet 0   trimethoprim-polymyxin b (POLYTRIM) ophthalmic solution Place 1 drop into both eyes every 4 (four) hours.     No current facility-administered medications on file prior to visit.    ALLERGIES: No Known Allergies  FAMILY HISTORY: Family History  Problem Relation Age of Onset   Hyperlipidemia Mother    Heart disease Mother    Hypertension Mother    Stroke Mother    Heart disease Father        MI   Colon cancer Neg Hx       Objective:  *** General: No acute distress.  Patient appears ***-groomed.   Head:  Normocephalic/atraumatic Eyes:  Fundi examined but not visualized Neck: supple, no paraspinal tenderness, full range of motion Heart:  Regular rate and rhythm Lungs:  Clear to auscultation bilaterally Back: No paraspinal tenderness Neurological Exam: alert and oriented.  Speech fluent and not dysarthric, language intact.  CN II-XII intact. Bulk and tone normal, muscle strength 5/5 throughout.  Sensation to light touch intact.  Deep tendon reflexes 2+ throughout, toes downgoing.  Finger to nose testing intact.  Gait normal, Romberg negative.   Shon Millet, DO  CC: ***

## 2023-01-24 ENCOUNTER — Ambulatory Visit: Payer: Medicare Other | Admitting: Neurology

## 2023-02-03 DIAGNOSIS — M79602 Pain in left arm: Secondary | ICD-10-CM | POA: Diagnosis not present

## 2023-02-03 DIAGNOSIS — U071 COVID-19: Secondary | ICD-10-CM | POA: Diagnosis not present

## 2023-03-21 DIAGNOSIS — Z23 Encounter for immunization: Secondary | ICD-10-CM | POA: Diagnosis not present

## 2023-03-23 ENCOUNTER — Telehealth: Payer: Self-pay | Admitting: Pharmacist

## 2023-03-23 NOTE — Progress Notes (Signed)
03/23/2023  [name] 1946/05/17 [id number]   Reason for call: 2025 Patient Assistance Re-Enrollment - Januvia   Outreach:  Unsuccessful telephone call attempt #1 to patient.   Unable to leave message  Plan:  -I will make another outreach attempt to patient within 3-4 business days.   Andre [name], PharmD Clinical Pharmacist Direct Dial: [phone]

## 2023-03-28 ENCOUNTER — Telehealth: Payer: Self-pay | Admitting: Pharmacist

## 2023-03-28 NOTE — Telephone Encounter (Signed)
03/28/2023  ILYAAS KNACK 08/11/1945 [id number]   2025 Medication Assistance Renewal Application Summary:  Patient was outreached regarding medication assistance renewal for 2025. Verified address, anticipated insurance for 2025, and income has not changed. Patient remains interested in PAP for 2025 for Januvia, no other new medications were identified for medication assistance.   Plan: I will route patient assistance letter to pharmacy technician who will coordinate patient assistance program application process for medications listed above.  Pharmacy technician will assist with obtaining all required documents from both patient and provider(s) and submit application(s) once completed.    Thank you for allowing pharmacy to be a part of this patient's care.  Reynold Bowen, PharmD Clinical Pharmacist [place] Direct Dial: [phone]

## 2023-03-29 DIAGNOSIS — H43812 Vitreous degeneration, left eye: Secondary | ICD-10-CM | POA: Diagnosis not present

## 2023-03-31 ENCOUNTER — Telehealth: Payer: Self-pay | Admitting: Pharmacy Technician

## 2023-03-31 DIAGNOSIS — Z5986 Financial insecurity: Secondary | ICD-10-CM

## 2023-03-31 NOTE — [facility] Notes (Signed)
[facility] Customer service manager [facility])                                            [facility] Quality Pharmacy Team    03/31/2023  Andre [name] [birth year] [id number]                                      Medication Assistance Referral  Referral From: [facility] RPh Tiffany B.  Medication/Company: Alma Friend[employer] / [employer] Patient application portion:  Mining engineer portion: Interoffice Mailed to Dr. Nita Sells Provider address/fax verified via: Office website  Pattricia Boss, CPhT [place]  Office: [phone] Fax: [phone] Email: [email or web address]

## 2023-04-04 DIAGNOSIS — H4312 Vitreous hemorrhage, left eye: Secondary | ICD-10-CM | POA: Diagnosis not present

## 2023-04-05 DIAGNOSIS — H4312 Vitreous hemorrhage, left eye: Secondary | ICD-10-CM | POA: Diagnosis not present

## 2023-04-11 DIAGNOSIS — R7301 Impaired fasting glucose: Secondary | ICD-10-CM | POA: Diagnosis not present

## 2023-04-11 DIAGNOSIS — I10 Essential (primary) hypertension: Secondary | ICD-10-CM | POA: Diagnosis not present

## 2023-04-12 DIAGNOSIS — H35372 Puckering of macula, left eye: Secondary | ICD-10-CM | POA: Diagnosis not present

## 2023-04-17 DIAGNOSIS — E782 Mixed hyperlipidemia: Secondary | ICD-10-CM | POA: Diagnosis not present

## 2023-04-17 DIAGNOSIS — M5030 Other cervical disc degeneration, unspecified cervical region: Secondary | ICD-10-CM | POA: Diagnosis not present

## 2023-04-17 DIAGNOSIS — R944 Abnormal results of kidney function studies: Secondary | ICD-10-CM | POA: Diagnosis not present

## 2023-04-17 DIAGNOSIS — I10 Essential (primary) hypertension: Secondary | ICD-10-CM | POA: Diagnosis not present

## 2023-04-17 DIAGNOSIS — R519 Headache, unspecified: Secondary | ICD-10-CM | POA: Diagnosis not present

## 2023-04-17 DIAGNOSIS — R634 Abnormal weight loss: Secondary | ICD-10-CM | POA: Diagnosis not present

## 2023-04-17 DIAGNOSIS — D72829 Elevated white blood cell count, unspecified: Secondary | ICD-10-CM | POA: Diagnosis not present

## 2023-04-17 DIAGNOSIS — Z0001 Encounter for general adult medical examination with abnormal findings: Secondary | ICD-10-CM | POA: Diagnosis not present

## 2023-04-17 DIAGNOSIS — G4700 Insomnia, unspecified: Secondary | ICD-10-CM | POA: Diagnosis not present

## 2023-04-17 DIAGNOSIS — I700 Atherosclerosis of aorta: Secondary | ICD-10-CM | POA: Diagnosis not present

## 2023-04-17 DIAGNOSIS — E1169 Type 2 diabetes mellitus with other specified complication: Secondary | ICD-10-CM | POA: Diagnosis not present

## 2023-04-19 ENCOUNTER — Other Ambulatory Visit: Payer: Self-pay | Admitting: Neurology

## 2023-05-11 ENCOUNTER — Other Ambulatory Visit: Payer: Self-pay | Admitting: Pharmacy Technician

## 2023-05-11 DIAGNOSIS — M545 Low back pain, unspecified: Secondary | ICD-10-CM | POA: Diagnosis not present

## 2023-05-11 DIAGNOSIS — M25551 Pain in right hip: Secondary | ICD-10-CM | POA: Diagnosis not present

## 2023-05-11 DIAGNOSIS — R208 Other disturbances of skin sensation: Secondary | ICD-10-CM | POA: Diagnosis not present

## 2023-05-11 DIAGNOSIS — Z5986 Financial insecurity: Secondary | ICD-10-CM

## 2023-05-11 DIAGNOSIS — M5030 Other cervical disc degeneration, unspecified cervical region: Secondary | ICD-10-CM | POA: Diagnosis not present

## 2023-05-11 NOTE — [facility] Notes (Signed)
Pharmacy Medication Assistance Program Note    05/11/2023  Patient ID: Andre [name], male   DOB: 05/30/1946, 77 y.o.   MRN: [id number]     05/11/2023  Outreach Medication One  Initial Outreach Date (Medication One) 03/29/2023  Manufacturer Medication One [employer]  [employer] Januvia  Dose of Januvia 100mg   Type of Radiographer, therapeutic Assistance  Date Application Sent to Patient 03/29/2023  Application Items Requested Application;Proof of Income;Other  Date Application Sent to Prescriber 04/05/2023  Name of Prescriber Benita Stabile  Date Application Received From Patient 04/12/2023  Application Items Received From Patient Application;Proof of Income;Other  Date Application Received From Provider 04/19/2023  Date Application Submitted to Manufacturer 05/10/2023  Method Application Sent to Manufacturer Mailed       Signature   Kristopher Glee [facility]  Office: [phone] Fax: [phone] Email: [email or web address]

## 2023-05-18 DIAGNOSIS — L110 Acquired keratosis follicularis: Secondary | ICD-10-CM | POA: Diagnosis not present

## 2023-05-18 DIAGNOSIS — I739 Peripheral vascular disease, unspecified: Secondary | ICD-10-CM | POA: Diagnosis not present

## 2023-05-18 DIAGNOSIS — M79675 Pain in left toe(s): Secondary | ICD-10-CM | POA: Diagnosis not present

## 2023-05-18 DIAGNOSIS — M79672 Pain in left foot: Secondary | ICD-10-CM | POA: Diagnosis not present

## 2023-05-18 DIAGNOSIS — E1140 Type 2 diabetes mellitus with diabetic neuropathy, unspecified: Secondary | ICD-10-CM | POA: Diagnosis not present

## 2023-05-18 DIAGNOSIS — M79674 Pain in right toe(s): Secondary | ICD-10-CM | POA: Diagnosis not present

## 2023-05-18 DIAGNOSIS — M79671 Pain in right foot: Secondary | ICD-10-CM | POA: Diagnosis not present

## 2023-05-23 DIAGNOSIS — H02002 Unspecified entropion of right lower eyelid: Secondary | ICD-10-CM | POA: Diagnosis not present

## 2023-05-23 DIAGNOSIS — H0102A Squamous blepharitis right eye, upper and lower eyelids: Secondary | ICD-10-CM | POA: Diagnosis not present

## 2023-05-23 DIAGNOSIS — H04123 Dry eye syndrome of bilateral lacrimal glands: Secondary | ICD-10-CM | POA: Diagnosis not present

## 2023-05-23 DIAGNOSIS — Z961 Presence of intraocular lens: Secondary | ICD-10-CM | POA: Diagnosis not present

## 2023-05-23 DIAGNOSIS — H0102B Squamous blepharitis left eye, upper and lower eyelids: Secondary | ICD-10-CM | POA: Diagnosis not present

## 2023-05-23 DIAGNOSIS — E119 Type 2 diabetes mellitus without complications: Secondary | ICD-10-CM | POA: Diagnosis not present

## 2023-05-24 DIAGNOSIS — H35372 Puckering of macula, left eye: Secondary | ICD-10-CM | POA: Diagnosis not present

## 2023-06-08 DIAGNOSIS — R0789 Other chest pain: Secondary | ICD-10-CM | POA: Diagnosis not present

## 2023-06-08 DIAGNOSIS — R252 Cramp and spasm: Secondary | ICD-10-CM | POA: Diagnosis not present

## 2023-06-16 ENCOUNTER — Telehealth: Payer: Self-pay | Admitting: Pharmacy Technician

## 2023-06-16 DIAGNOSIS — Z5986 Financial insecurity: Secondary | ICD-10-CM

## 2023-06-16 NOTE — Progress Notes (Signed)
 Pharmacy Medication Assistance Program Note    06/16/2023  Patient ID: Andre [name], male   DOB: 21-Sep-1945, 78 y.o.   MRN: [id number]     05/11/2023 06/16/2023  Outreach Medication One  Initial Outreach Date (Medication One) 03/29/2023   Manufacturer Medication One [employer]   [employer] Januvia   Dose of Januvia 100mg    Type of Radiographer, Therapeutic Assistance   Date Application Sent to Patient 03/29/2023   Application Items Requested Application;Proof of Income;Other   Date Application Sent to Prescriber 04/05/2023   Name of Prescriber Norleen JENEANE [place]   Date Application Received From Patient 04/12/2023   Application Items Received From Patient Application;Proof of Income;Other   Date Application Received From Provider 04/19/2023   Date Application Submitted to Manufacturer 05/10/2023   Method Application Sent to Manufacturer Mailed   Patient Assistance Determination  Approved  Approval Start Date  06/07/2023  Approval End Date  06/05/2024  Patient Notification Method  Telephone Call  Telephone Call Outcome  Successful   Care coordination call placed to [employer] in regard to Januvia application. Spoke to [place] who informs patient is APPROVED 06/07/23-06/05/24. Patient will have to call [employer] to order his refills by dialing [phone]. Per [place], his next can be called in around 08/23/23. Successful outreach to patient's friend, Bruna Africa. HIPAA verified. Informed Ms. Hamm of patient's approval, next fill date and refill procedure. She verbalized understanding.  Signature  Kate Marzette Sola [facility]  Office: [phone] Fax: [phone] Email: [email or web address]

## 2023-07-10 ENCOUNTER — Ambulatory Visit: Payer: Medicare Other | Attending: Nurse Practitioner | Admitting: Nurse Practitioner

## 2023-07-10 ENCOUNTER — Telehealth: Payer: Self-pay | Admitting: Nurse Practitioner

## 2023-07-10 ENCOUNTER — Encounter: Payer: Self-pay | Admitting: Nurse Practitioner

## 2023-07-10 VITALS — BP 116/72 | HR 83 | Ht 73.0 in | Wt 211.4 lb

## 2023-07-10 DIAGNOSIS — R079 Chest pain, unspecified: Secondary | ICD-10-CM | POA: Diagnosis not present

## 2023-07-10 DIAGNOSIS — I10 Essential (primary) hypertension: Secondary | ICD-10-CM

## 2023-07-10 DIAGNOSIS — E785 Hyperlipidemia, unspecified: Secondary | ICD-10-CM

## 2023-07-10 DIAGNOSIS — R0609 Other forms of dyspnea: Secondary | ICD-10-CM | POA: Diagnosis not present

## 2023-07-10 DIAGNOSIS — Z8673 Personal history of transient ischemic attack (TIA), and cerebral infarction without residual deficits: Secondary | ICD-10-CM | POA: Diagnosis not present

## 2023-07-10 MED ORDER — NITROGLYCERIN 0.4 MG SL SUBL: 0.4000 mg | SUBLINGUAL_TABLET | SUBLINGUAL | 3 refills | Status: AC | PRN

## 2023-07-10 NOTE — Patient Instructions (Addendum)
Medication Instructions:  The proper use and anticipated side effects of nitroglycerin has been carefully explained.  If a single episode of chest pain is not relieved by one tablet, the patient will try another within 5 minutes; and if this doesn't relieve the pain, the patient is instructed to call 911 for transportation to an emergency department.  Labwork: None   Testing/Procedures: Your physician has requested that you have an echocardiogram. Echocardiography is a painless test that uses sound waves to create images of your heart. It provides your doctor with information about the size and shape of your heart and how well your heart's chambers and valves are working. This procedure takes approximately one hour. There are no restrictions for this procedure. Please do NOT wear cologne, perfume, aftershave, or lotions (deodorant is allowed). Please arrive 15 minutes prior to your appointment time.  Please note: We ask at that you not bring children with you during ultrasound (echo/ vascular) testing. Due to room size and safety concerns, children are not allowed in the ultrasound rooms during exams. Our front office staff cannot provide observation of children in our lobby area while testing is being conducted. An adult accompanying a patient to their appointment will only be allowed in the ultrasound room at the discretion of the ultrasound technician under special circumstances. We apologize for any inconvenience. Your physician has requested that you have a lexiscan myoview. For further information please visit [email or web address] Please follow instruction sheet, as given.  Follow-Up: Your physician recommends that you schedule a follow-up appointment in: 4-6 weeks   Any Other Special Instructions Will Be Listed Below (If Applicable).  If you need a refill on your cardiac medications before your next appointment, please call your pharmacy.

## 2023-07-10 NOTE — Telephone Encounter (Signed)
Checking percert on the following patient for testing scheduled at [facility].   LEXISCAN - 07/12/2023

## 2023-07-10 NOTE — [facility] Notes (Signed)
Cardiology Office Note:  .   Date:  07/10/2023  ID:  Andre [name], DOB [birth year], MRN [id number] PCP: Andre Stabile, MD  [facility] Providers Cardiologist:  Andre Rich, MD    History of Present Illness: .   Andre [name] is a 78 y.o. male with a PMH of chest pain, hypertension, hyperlipidemia, type 2 diabetes, former smoker, past history of TIAs, OSA, GERD, who presents today for 1 year follow-up.  Last seen by Dr. Dina [name] on December 27, 2021.  Patient noted some chest pain with symptoms that presented to be mixed.  Due to multiple CAD risk factors, a exercise nuclear stress test was arranged to further evaluate.  Stress test was WNL.  Today he presents for 1 year follow-up with his friend.  He admits to chronic chest pain across his entire chest, seems to be triggered with high exertional activities, friend says he has to rest for symptoms to be relieved usually within 15-20 minutes. 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  Says symptoms seem to be worse more recently.  He shares with me that he has in-laws staying with him that is causing him some stress, says symptoms seem to be worse during periods of emotional upset. Denies any palpitations, syncope, presyncope, dizziness, orthopnea, PND, swelling or significant weight changes, acute bleeding, or claudication.   ROS: Negative.  See HPI. 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  Studies Reviewed: Marland Kitchen    EKG: EKG Interpretation Date/Time:  Monday July 10 2023 09:07:15 EST Ventricular Rate:  76 PR Interval:  166 QRS Duration:  76 QT Interval:  366 QTC Calculation: 411 R Axis:   64  Text Interpretation: Normal sinus rhythm Nonspecific ST and T wave abnormality When compared with ECG of 27-May-2020 08:38, PREVIOUS ECG IS PRESENT Confirmed by Andre [name] [phone]) on 07/10/2023 9:17:28 AM ]  Eugenie Birks 12/2021:    Lexiscan stress is electrically negative for  ischemia   Myoview scan shows normal perfusion   No ischemia or scar   LVEF calculated at 70%   Overall low risk study  Physical Exam:   VS:  BP 116/72   Pulse 83   Ht 6\' 1"  (1.854 m)   Wt 211 lb 6.4 oz (95.9 kg)   SpO2 92%   BMI 27.89 kg/m    Wt Readings from Last 3 Encounters:  07/10/23 211 lb 6.4 oz (95.9 kg)  08/08/22 204 lb (92.5 kg)  12/27/21 206 lb (93.4 kg)    GEN: Well nourished, well developed in no acute distress NECK: No JVD; No carotid bruits CARDIAC: S1/S2, RRR, no murmurs, rubs, gallops RESPIRATORY:  Clear to auscultation without rales, wheezing or rhonchi  ABDOMEN: Soft, non-tender, non-distended EXTREMITIES:  No edema; No deformity   ASSESSMENT AND PLAN: .    Chest pain of uncertain etiology, DOE He admits to exertional chest pain with dyspnea on exertion has been chronic over the past year, recent worsening symptoms.  Reviewed NST in 2023 that was low risk and reassuring.  EKG was negative for any acute ischemic changes. Continue current medication regimen. Discussed CCTA vs NST and recommended CCTA to evaluate for any obstructive CAD, however pt and friend prefer to do local testing and I discussed risks vs benefits of NST, and patient verbalized understanding and is agreeable to proceed.  Will prescribe nitroglycerin as needed for chest pain.  I instructed patient regarding this medication and he verbalized understanding.  Heart healthy diet and regular cardiovascular exercise encouraged. Care and ED precautions discussed. Will order Echocardiogram to evaluate his DOE.     Informed Consent   Shared Decision Making/Informed Consent The risks [chest pain, shortness of breath, cardiac arrhythmias, dizziness, blood pressure fluctuations, myocardial infarction, stroke/transient ischemic attack, nausea, vomiting, allergic reaction, radiation exposure, metallic taste sensation and life-threatening complications (estimated to be 1 in 10,000)], benefits (risk stratification,  diagnosing coronary artery disease, treatment guidance) and alternatives of a nuclear stress test were discussed in detail with [name] and he agrees to proceed.      2. HTN Blood pressure stable. Discussed to monitor BP at home at least 2 hours after medications and sitting for 5-10 minutes.  Continue current medication regimen. Heart healthy diet and regular cardiovascular exercise encouraged.   3. HLD LDL 90 in November 2024.  Continue atorvastatin.  This is being managed by his PCP. Heart healthy diet and regular cardiovascular exercise encouraged.   4. Hx of TIA's Denies any recent symptoms.  Continue current medication regimen. Continue to follow with PCP.   Dispo: Follow-up with me/APP in 4 to 6 weeks or sooner if any changes.  Signed, Andre Dory, NP

## 2023-07-12 ENCOUNTER — Ambulatory Visit (HOSPITAL_BASED_OUTPATIENT_CLINIC_OR_DEPARTMENT_OTHER)
Admission: RE | Admit: 2023-07-12 | Discharge: 2023-07-12 | Disposition: A | Discharge status: Home / Self Care | Payer: Medicare Other | Source: Ambulatory Visit | Attending: Nurse Practitioner | Admitting: Nurse Practitioner

## 2023-07-12 ENCOUNTER — Ambulatory Visit (HOSPITAL_COMMUNITY)
Admission: RE | Admit: 2023-07-12 | Discharge: 2023-07-12 | Disposition: A | Discharge status: Home / Self Care | Payer: Medicare Other | Source: Ambulatory Visit | Attending: Nurse Practitioner | Admitting: Nurse Practitioner

## 2023-07-12 DIAGNOSIS — R079 Chest pain, unspecified: Secondary | ICD-10-CM | POA: Insufficient documentation

## 2023-07-12 LAB — NM MYOCAR MULTI W/SPECT W/WALL MOTION / EF
LV dias vol: 71 mL (ref 62–150)
LV sys vol: 31 mL
Nuc Stress EF: 56 %
Peak HR: 79 {beats}/min
RATE: 0.4
Rest HR: 68 {beats}/min
Rest Nuclear Isotope Dose: 10 mCi
SDS: 1
SRS: 0
SSS: 1
ST Depression (mm): 0 mm
Stress Nuclear Isotope Dose: 30 mCi
TID: 1.13

## 2023-07-12 MED ORDER — SODIUM CHLORIDE FLUSH 0.9 % IV SOLN
INTRAVENOUS | Status: AC
  Administered 2023-07-12: 10 mL via INTRAVENOUS
  Filled 2023-07-12: qty 10

## 2023-07-12 MED ORDER — TECHNETIUM TC 99M TETROFOSMIN IV KIT
10.0000 | PACK | Freq: Once | INTRAVENOUS | Status: AC | PRN
  Administered 2023-07-12: 10 via INTRAVENOUS

## 2023-07-12 MED ORDER — TECHNETIUM TC 99M TETROFOSMIN IV KIT
30.0000 | PACK | Freq: Once | INTRAVENOUS | Status: AC | PRN
  Administered 2023-07-12: 30 via INTRAVENOUS

## 2023-07-12 MED ORDER — REGADENOSON 0.4 MG/5ML IV SOLN
INTRAVENOUS | Status: AC
  Administered 2023-07-12: 0.4 mg via INTRAVENOUS
  Filled 2023-07-12: qty 5

## 2023-07-17 ENCOUNTER — Ambulatory Visit: Payer: Medicare Other | Admitting: Nurse Practitioner

## 2023-07-20 ENCOUNTER — Ambulatory Visit: Payer: Medicare Other | Attending: Nurse Practitioner

## 2023-07-20 DIAGNOSIS — R0609 Other forms of dyspnea: Secondary | ICD-10-CM | POA: Diagnosis not present

## 2023-07-20 DIAGNOSIS — R079 Chest pain, unspecified: Secondary | ICD-10-CM | POA: Diagnosis not present

## 2023-07-21 LAB — ECHOCARDIOGRAM COMPLETE
AR max vel: 2.85 cm2
AV Area VTI: 2.59 cm2
AV Area mean vel: 2.78 cm2
AV Mean grad: 4 mm[Hg]
AV Peak grad: 6.7 mm[Hg]
AV Vena cont: 0.4 cm
Ao pk vel: 1.29 m/s
Area-P 1/2: 3.03 cm2
Calc EF: 58.3 %
MV VTI: 2.42 cm2
P 1/2 time: 594 ms
S' Lateral: 2.7 cm
Single Plane A2C EF: 54.7 %
Single Plane A4C EF: 57.1 %

## 2023-08-02 DIAGNOSIS — H33312 Horseshoe tear of retina without detachment, left eye: Secondary | ICD-10-CM | POA: Diagnosis not present

## 2023-08-02 DIAGNOSIS — H35372 Puckering of macula, left eye: Secondary | ICD-10-CM | POA: Diagnosis not present

## 2023-08-14 DIAGNOSIS — E782 Mixed hyperlipidemia: Secondary | ICD-10-CM | POA: Diagnosis not present

## 2023-08-14 DIAGNOSIS — R5383 Other fatigue: Secondary | ICD-10-CM | POA: Diagnosis not present

## 2023-08-14 DIAGNOSIS — E1169 Type 2 diabetes mellitus with other specified complication: Secondary | ICD-10-CM | POA: Diagnosis not present

## 2023-08-21 ENCOUNTER — Encounter: Payer: Self-pay | Admitting: Nurse Practitioner

## 2023-08-21 ENCOUNTER — Ambulatory Visit: Payer: Medicare Other | Attending: Nurse Practitioner | Admitting: Nurse Practitioner

## 2023-08-21 VITALS — BP 118/76 | HR 92 | Ht 73.0 in | Wt 212.0 lb

## 2023-08-21 DIAGNOSIS — I10 Essential (primary) hypertension: Secondary | ICD-10-CM | POA: Diagnosis not present

## 2023-08-21 DIAGNOSIS — R0609 Other forms of dyspnea: Secondary | ICD-10-CM

## 2023-08-21 DIAGNOSIS — E785 Hyperlipidemia, unspecified: Secondary | ICD-10-CM | POA: Diagnosis not present

## 2023-08-21 DIAGNOSIS — Z8673 Personal history of transient ischemic attack (TIA), and cerebral infarction without residual deficits: Secondary | ICD-10-CM

## 2023-08-21 DIAGNOSIS — R079 Chest pain, unspecified: Secondary | ICD-10-CM | POA: Diagnosis not present

## 2023-08-21 NOTE — Progress Notes (Signed)
 Cardiology Office Note:  .   Date:  08/21/2023  ID:  Andre [name], DOB 01-21-1946, MRN [id number] PCP: Benita Stabile, MD  [facility] Providers Cardiologist:  Dina Rich, MD    History of Present Illness: .   Andre [name] is a 77 y.o. male with a PMH of chest pain, hypertension, hyperlipidemia, type 2 diabetes, former smoker, past history of TIAs, COPD, OSA, GERD, who presents today for scheduled follow-up.  Last seen by Dr. Dina Rich on December 27, 2021.  Patient noted some chest pain with symptoms that presented to be mixed.  Due to multiple CAD risk factors, a exercise nuclear stress test was arranged to further evaluate.  Stress test was WNL.  07/10/2023 - Today he presents for 1 year follow-up with his friend.  He admits to chronic chest pain across his entire chest, seems to be triggered with high exertional activities, friend says he has to rest for symptoms to be relieved usually within 15-20 minutes. 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  Says symptoms seem to be worse more recently. He shares with me that he has in-laws staying with him that is causing him some stress, says symptoms seem to be worse during periods of emotional upset. Denies any palpitations, syncope, presyncope, dizziness, orthopnea, PND, swelling or significant weight changes, acute bleeding, or claudication.  08/21/2023 -presents today for follow-up with his friend.  Patient is a difficult historian due to being hard of hearing.  Admits to generalized pains.  Episodes of chest pain have reduced since last office visit.  He believes his symptoms have improved some.  Friend says that his breathing has improved as he does not report being short of breath as often. Denies any recent chest pain, shortness of breath, palpitations, syncope, presyncope, dizziness, orthopnea, PND, swelling or significant weight changes, acute bleeding, or claudication. Has  been told by PCP that he has COPD per friend's report.    ROS: Negative.  See HPI. 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  Studies Reviewed: Marland Kitchen    EKG: EKG is not ordered today.  Echo 07/2023: 1. Left ventricular ejection fraction, by estimation, is 55 to 60%. The  left ventricle has normal function. The left ventricle has no regional  wall motion abnormalities. There is mild left ventricular hypertrophy.  Left ventricular diastolic parameters  are consistent with Grade I diastolic dysfunction (impaired relaxation).  The global longitudinal strain is indeterminate.   2. Right ventricular systolic function is normal. The right ventricular  size is normal. Tricuspid regurgitation signal is inadequate for assessing  PA pressure.   3. The mitral valve is normal in structure. No evidence of mitral valve  regurgitation. No evidence of mitral stenosis.   4. The aortic valve is tricuspid. Aortic valve regurgitation is mild. No  aortic stenosis is present.   Comparison(s): No prior Echocardiogram.  Lexiscan 07/2023:   Findings are consistent with no ischemia. The study is low risk.   No ST deviation was noted. The ECG was negative for ischemia.   LV perfusion is normal.  No significant myocardial perfusion defects to indicate scar or ischemia in the setting of diaphragmatic attenuation.   Left ventricular function is normal. Nuclear stress EF: 56%.  Lexiscan 12/2021:    Lexiscan stress is electrically negative for ischemia   Myoview scan shows normal perfusion   No ischemia or scar   LVEF calculated at 70%   Overall  low risk study  Physical Exam:   VS:  BP 118/76   Pulse 92   Ht 6\' 1"  (1.854 m)   Wt 212 lb (96.2 kg)   SpO2 91%   BMI 27.97 kg/m    Wt Readings from Last 3 Encounters:  08/21/23 212 lb (96.2 kg)  07/10/23 211 lb 6.4 oz (95.9 kg)  08/08/22 204 lb (92.5 kg)    GEN: Well nourished, well developed in no acute distress NECK: No JVD; No carotid bruits CARDIAC:  S1/S2, RRR, no murmurs, rubs, gallops RESPIRATORY:  Clear and diminished to auscultation without rales, wheezing or rhonchi  ABDOMEN: Soft, non-tender, non-distended EXTREMITIES:  No edema; No deformity   ASSESSMENT AND PLAN: .    Chest pain of uncertain etiology, DOE He admits to exertional chest pain with dyspnea on exertion has been chronic over the past year, appears to be recently improved.  Reviewed recent NST that was low risk and reassuring.  Discussed medication management and medication options.  Patient politely declines medication management and politely requests to monitor at this time.  Continue current medication regimen. Heart healthy diet and regular cardiovascular exercise encouraged. Care and ED precautions discussed.  Plan to initiate low-dose Imdur if symptoms not improved by next office visit.  Asked patient and friend to provide documentation by PCP I confirm COPD diagnosis.  They verbalized understanding.  2. HTN Blood pressure stable. Discussed to monitor BP at home at least 2 hours after medications and sitting for 5-10 minutes.  Continue current medication regimen. Heart healthy diet and regular cardiovascular exercise encouraged.   3. HLD LDL 90 in November 2024.  Continue atorvastatin.  This is being managed by his PCP. Heart healthy diet and regular cardiovascular exercise encouraged.   4. Hx of TIA's Denies any recent symptoms.  Continue current medication regimen. Continue to follow with PCP.   Dispo: Follow-up with me/APP in 4 weeks or sooner if any changes.  Signed, Sharlene Dory, NP

## 2023-08-21 NOTE — Patient Instructions (Signed)
Medication Instructions:  Your physician recommends that you continue on your current medications as directed. Please refer to the Current Medication list given to you today.  Labwork: None  Testing/Procedures: None  Follow-Up: Your physician recommends that you schedule a follow-up appointment in: 1 Month   Any Other Special Instructions Will Be Listed Below (If Applicable).  If you need a refill on your cardiac medications before your next appointment, please call your pharmacy.

## 2023-08-23 ENCOUNTER — Other Ambulatory Visit (HOSPITAL_COMMUNITY): Payer: Self-pay | Admitting: Internal Medicine

## 2023-08-23 ENCOUNTER — Telehealth: Payer: Self-pay

## 2023-08-23 DIAGNOSIS — R519 Headache, unspecified: Secondary | ICD-10-CM | POA: Diagnosis not present

## 2023-08-23 DIAGNOSIS — E782 Mixed hyperlipidemia: Secondary | ICD-10-CM | POA: Diagnosis not present

## 2023-08-23 DIAGNOSIS — E1169 Type 2 diabetes mellitus with other specified complication: Secondary | ICD-10-CM | POA: Diagnosis not present

## 2023-08-23 DIAGNOSIS — M545 Low back pain, unspecified: Secondary | ICD-10-CM | POA: Insufficient documentation

## 2023-08-23 DIAGNOSIS — F17201 Nicotine dependence, unspecified, in remission: Secondary | ICD-10-CM

## 2023-08-23 DIAGNOSIS — D72829 Elevated white blood cell count, unspecified: Secondary | ICD-10-CM | POA: Diagnosis not present

## 2023-08-23 DIAGNOSIS — E1165 Type 2 diabetes mellitus with hyperglycemia: Secondary | ICD-10-CM | POA: Diagnosis not present

## 2023-08-23 DIAGNOSIS — I10 Essential (primary) hypertension: Secondary | ICD-10-CM | POA: Diagnosis not present

## 2023-08-23 DIAGNOSIS — M5030 Other cervical disc degeneration, unspecified cervical region: Secondary | ICD-10-CM | POA: Diagnosis not present

## 2023-08-23 DIAGNOSIS — R944 Abnormal results of kidney function studies: Secondary | ICD-10-CM | POA: Diagnosis not present

## 2023-08-23 DIAGNOSIS — R634 Abnormal weight loss: Secondary | ICD-10-CM | POA: Diagnosis not present

## 2023-08-23 NOTE — Progress Notes (Signed)
   08/23/2023  Patient ID: Andre [name], male   DOB: February 06, 1946, 78 y.o.   MRN: [id number]   Med Assistance/Diabetes Management  Referred to me by PCP, diabetes has been relatively well controlled historically but is trending up. A1C was 8.3 today, up from 7.3 in November. PCP started [place] today and gave samples, will likely need PAP. Patient is hard of hearing so I spoke with his representative [name] (HIPAA consent in chart). I counseled her on administration and side effects of Farxiga and will start PAP. I will also start PAP for Breztri since this was another medication he was not able to afford earlier this year.    Dennie Bible says [name] has a difficult time understanding his diabetes, particularly the diet recommendations given to him by PCP. Scheduled an initial visit with Dennie Bible and Debroah Loop in office to review basic information about diabetes management and to assist in helping with diet/lifestyle modification plans.  Fayette Pho, PharmD

## 2023-08-28 ENCOUNTER — Telehealth: Payer: Self-pay

## 2023-08-28 NOTE — Progress Notes (Signed)
   08/28/2023  Patient ID: Andre [name], male   DOB: [birth year], 78 y.o.   MRN: [id number]    08/28/2023 Name: Andre [name] MRN: [id number] DOB: 08-20-1945   Andre [name] is a 78 y.o. year old male who was referred for medication management by their primary care provider, Margo Aye Kathleene Hazel, MD. They presented for a face to face visit today.   They were referred to the pharmacist by their PCP for assistance in managing diabetes    Subjective:  Care Team: Primary Care Provider: Benita Stabile, MD ; Next Scheduled Visit: 09/25/23  Medication Access/Adherence  Current Pharmacy:  [facility] - [place], [place] - [address] [place] [address] Phone: [phone] Fax: [phone]   Patient reports affordability concerns with their medications: Yes  Patient reports access/transportation concerns to their pharmacy: No  Patient reports adherence concerns with their medications:  No     Diabetes:  Current medications:  Metformin 500 mg - 1 tablet in the morning and 2 in the evening with meals Farxiga 10 mg - 1 tablet daily - gave samples and applied for PAP through [employer] Januvia 100 mg - 1 tablet daily - gets from PAP through [employer]  Medications tried in the past:  Glipizide ER  Patient denies hypoglycemic s/sx including dizziness, shakiness, sweating.  Patient reports hyperglycemic symptoms including: polyuria, nocturia  Current meal patterns:  - Breakfast: Eats 3-4 Eggo waffles w/ cup of coffee (black). Will sometimes have boiled eggs w/ butter or a pop tart in place of waffles if he's not as hungry. - Lunch/Supper: Daughter or son-in law make meals and he will sometimes heat what they have, typically chicken, roast, hamburgers with potatoes. Does not cook so will make meals for himself. Canned soup, various frozen meals, or sandwiches.  - Snacks: Saltine or [employer] with cheese and fruit - Drinks: Water, gatorade zero, or small cans of coke zero.  Denies drinking any sugary beverages  Current physical activity: None, walks with a cane when they go to the grocery store but gets out of breath quickly and doesn't have much energy. Interested in starting some kind of exercise again. Friend wants him to get into her cardiac rehab program if possible  Objective:  Last A1C: 8.3 on 08/14/23, up from 7.3 on 04/11/23 Last GFR: 71 on 08/14/23 stable in the 70s w/ MACR <30  Assessment/Plan:   Diabetes: - Currently uncontrolled - Reviewed long term cardiovascular and renal outcomes of uncontrolled blood sugar - Reviewed dietary modifications including the diabetes plate method for eating.  - Reviewed what types of foods contain protein, fats, and carbohydrates and how carbohydrates impact blood sugar.  - Reviewed how to read nutritional labels and encouraged to limit carbohydrate intake to 30-45 grams per meal and snacks to 15 grams.  - Provided real-life examples based on the foods he typically eats and discussed alternatives to his current diet. - Encouraged adequate fluid intake consisting mainly of water.  - Meets financial criteria for Farxiga patient assistance program through [employer]. Completed form, got signatures from provider and patient and faxed form to [employer] for review and approval.  Follow Up/Plan: -Phone call scheduled with his friend Dennie Bible on 09/04/22 to see how samples of Markus Daft are working for him. Will send application for Breztri to [employer] if he feels there is benefit.   Fayette Pho, PharmD

## 2023-09-01 ENCOUNTER — Ambulatory Visit (HOSPITAL_COMMUNITY)
Admission: RE | Admit: 2023-09-01 | Discharge: 2023-09-01 | Disposition: A | Discharge status: Home / Self Care | Source: Ambulatory Visit | Attending: Internal Medicine | Admitting: Internal Medicine

## 2023-09-01 DIAGNOSIS — I700 Atherosclerosis of aorta: Secondary | ICD-10-CM | POA: Diagnosis not present

## 2023-09-01 DIAGNOSIS — J439 Emphysema, unspecified: Secondary | ICD-10-CM | POA: Diagnosis not present

## 2023-09-01 DIAGNOSIS — I2510 Atherosclerotic heart disease of native coronary artery without angina pectoris: Secondary | ICD-10-CM | POA: Diagnosis not present

## 2023-09-01 DIAGNOSIS — Z87891 Personal history of nicotine dependence: Secondary | ICD-10-CM | POA: Insufficient documentation

## 2023-09-01 DIAGNOSIS — J479 Bronchiectasis, uncomplicated: Secondary | ICD-10-CM | POA: Insufficient documentation

## 2023-09-01 DIAGNOSIS — F17201 Nicotine dependence, unspecified, in remission: Secondary | ICD-10-CM | POA: Diagnosis present

## 2023-09-01 DIAGNOSIS — Z122 Encounter for screening for malignant neoplasm of respiratory organs: Secondary | ICD-10-CM | POA: Insufficient documentation

## 2023-09-18 ENCOUNTER — Telehealth: Payer: Self-pay

## 2023-09-18 NOTE — Progress Notes (Signed)
    09/18/2023 Name: Andre [name] MRN: [id number] DOB: [birth year]   Andre [name] is a 78 y.o. year old male who was referred for medication management by their primary care provider, Del Favia Lethia Raveling, MD. They presented for a face to face visit today.   They were referred to the pharmacist by their PCP for assistance in managing diabetes   Diabetes: Last A1C: 8.3 on 08/14/23, up from 7.3 on 04/11/23 Last GFR: 71 on 08/14/23 stable in the 70s w/ MACR <30 Goal A1C: <7%  Tolerating Farxiga well, going to the bathroom more frequently but this has not been bothering him. He is also actively trying to increase his water consumption which could also be contributing to more frequent urination. Deatra Face says he has been reading food labels like we discussed and trying to portion his food to get 15 grams of carbs in a snack and 30 grams in a meal. Has been choosing items at the store with fewer carbs per serving as well. She got him enrolled in her cardiac rehab workouts and he has been participating in these a few times a week.   Assessment: Uncontrolled -Metformin 500 mg - 1 tablet in the morning and 2 in the evening with meals -Farxiga 10 mg - 1 tablet daily - (PAP via [employer]) -Januvia 100 mg - 1 tablet daily - (PAP via [employer])  Plan:  -Continue current medication regimen -Continue to focus on improving diet/lifestyle modifications -Updated labs with PCP in June  COPD: Diagnosed several years ago, has not been on any inhalers currently due to cost. Gave samples of Breztri last visit, this has helped tremendously according to [place]. He no longer complains of shortness of breath or chest tightness. He is able to be more physically active and doesn't get out of breath as quickly. Has received 90DS of inhalers from [employer]. Does not have rescue inhaler but doesn't see the need for one now that he's taking Breztri regularly.  Assessment: Controlled -Breztri - 2 puffs twice daily (PAP via [employer])  Plan: -Continue  current medication regimen -Encouraged to contact us  if SOB or other breathing symptoms worsen  Medication Management Has been taking tizanidine for several years at bedtime, trial of methocarbamol via NP in January, instead of taking in place of tizanidine he took in addition to. Discussed potential compounding side effects with use of both. Tizanidine is generally preferred over methocarbamol if effective (methocarbamol is on the beers list whereas tizanidine is not.) but if ineffective a trial of baclofen may be a better alternative than methocarbamol. Encouraged to use only as needed and preferably only at bedtime as prescribed.  Flint Hummer, PharmD

## 2023-09-25 DIAGNOSIS — F17201 Nicotine dependence, unspecified, in remission: Secondary | ICD-10-CM | POA: Diagnosis not present

## 2023-09-25 DIAGNOSIS — I10 Essential (primary) hypertension: Secondary | ICD-10-CM | POA: Diagnosis not present

## 2023-09-25 DIAGNOSIS — E039 Hypothyroidism, unspecified: Secondary | ICD-10-CM | POA: Diagnosis not present

## 2023-09-26 ENCOUNTER — Ambulatory Visit: Admitting: Nurse Practitioner

## 2023-09-28 DIAGNOSIS — L110 Acquired keratosis follicularis: Secondary | ICD-10-CM | POA: Diagnosis not present

## 2023-09-28 DIAGNOSIS — M79674 Pain in right toe(s): Secondary | ICD-10-CM | POA: Diagnosis not present

## 2023-09-28 DIAGNOSIS — M79675 Pain in left toe(s): Secondary | ICD-10-CM | POA: Diagnosis not present

## 2023-09-28 DIAGNOSIS — E1140 Type 2 diabetes mellitus with diabetic neuropathy, unspecified: Secondary | ICD-10-CM | POA: Diagnosis not present

## 2023-09-28 DIAGNOSIS — I739 Peripheral vascular disease, unspecified: Secondary | ICD-10-CM | POA: Diagnosis not present

## 2023-09-28 DIAGNOSIS — M79671 Pain in right foot: Secondary | ICD-10-CM | POA: Diagnosis not present

## 2023-09-28 DIAGNOSIS — M79672 Pain in left foot: Secondary | ICD-10-CM | POA: Diagnosis not present

## 2023-09-29 ENCOUNTER — Telehealth: Payer: Self-pay

## 2023-09-29 NOTE — Progress Notes (Signed)
    09/29/2023 Name: Andre [name] MRN: [id number] DOB: 09-Aug-1945   Diabetes: Last A1C: 8.3 on 08/14/23, up from 7.3 on 04/11/23 Last GFR: 71 on 08/14/23 stable in the 70s w/ MACR <30 Goal A1C: <7%  Deatra Face says he has continued to read food labels when choosing foods and portioning out his meals appropriately. He lost 4 lbs in the last month which she believes is a sign his dieting is working. He has been going to exercise classes with her and she said he claims he's feeling better than he has in a very long time.   Assessment: Uncontrolled -Metformin 500 mg - 1 tablet in the morning and 2 in the evening with meals -Farxiga 10 mg - 1 tablet daily - (PAP via [employer]) -Januvia 100 mg - 1 tablet daily - (PAP via [employer])  Plan:  -Continue current medication regimen -Continue to focus on improving diet/lifestyle modifications -Updated labs with PCP in June  Medication Management Pat reports he has been taking only tizanidine  at bedtime for his muscle spasms/back pain as we discussed. He hasn't been complaining about any pain recently so she assumes he must be doing ok. Again encouraged as needed use. She believes, and I agree, the exercise classes are benefiting his pain as well. Discussed the potential for de-prescribing in the future if his pain remains controlled but she does not want to "rock the boat" since he's seen great improvement lately.   Flint Hummer, PharmD

## 2023-11-17 ENCOUNTER — Encounter: Payer: Self-pay | Admitting: Nurse Practitioner

## 2023-11-17 ENCOUNTER — Ambulatory Visit: Attending: Nurse Practitioner | Admitting: Nurse Practitioner

## 2023-11-17 VITALS — BP 118/68 | HR 74 | Ht 73.0 in | Wt 204.6 lb

## 2023-11-17 DIAGNOSIS — E785 Hyperlipidemia, unspecified: Secondary | ICD-10-CM

## 2023-11-17 DIAGNOSIS — R079 Chest pain, unspecified: Secondary | ICD-10-CM

## 2023-11-17 DIAGNOSIS — I10 Essential (primary) hypertension: Secondary | ICD-10-CM | POA: Diagnosis not present

## 2023-11-17 DIAGNOSIS — R0609 Other forms of dyspnea: Secondary | ICD-10-CM

## 2023-11-17 DIAGNOSIS — E039 Hypothyroidism, unspecified: Secondary | ICD-10-CM | POA: Diagnosis not present

## 2023-11-17 DIAGNOSIS — E782 Mixed hyperlipidemia: Secondary | ICD-10-CM | POA: Diagnosis not present

## 2023-11-17 DIAGNOSIS — E1169 Type 2 diabetes mellitus with other specified complication: Secondary | ICD-10-CM | POA: Diagnosis not present

## 2023-11-17 DIAGNOSIS — Z8673 Personal history of transient ischemic attack (TIA), and cerebral infarction without residual deficits: Secondary | ICD-10-CM

## 2023-11-17 MED ORDER — ISOSORBIDE MONONITRATE ER 30 MG PO TB24: 15.0000 mg | ORAL_TABLET | Freq: Every day | ORAL | 6 refills | Status: DC

## 2023-11-17 MED ORDER — LOSARTAN POTASSIUM 25 MG PO TABS: 12.5000 mg | ORAL_TABLET | Freq: Every day | ORAL | 1 refills | Status: DC

## 2023-11-17 NOTE — Progress Notes (Signed)
 Cardiology Office Note:  .   Date:  11/17/2023 ID:  Andre [name], DOB [birth year], MRN [id number] PCP: Omie Bickers, MD  [facility] Providers Cardiologist:  Armida Lander, MD    History of Present Illness: .   Andre [name] is a 78 y.o. male with a PMH of chest pain, hypertension, hyperlipidemia, type 2 diabetes, former smoker, past history of TIAs, COPD, OSA, GERD, who presents today for scheduled follow-up.  Last seen by Dr. Armida Lander on December 27, 2021.  Patient noted some chest pain with symptoms that presented to be mixed.  Due to multiple CAD risk factors, a exercise nuclear stress test was arranged to further evaluate.  Stress test was WNL.  07/10/2023 - Today he presents for 1 year follow-up with his friend.  He admits to chronic chest pain across his entire chest, seems to be triggered with high exertional activities, friend says he has to rest for symptoms to be relieved usually within 15-20 minutes. 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  Says symptoms seem to be worse more recently. He shares with me that he has in-laws staying with him that is causing him some stress, says symptoms seem to be worse during periods of emotional upset. Denies any palpitations, syncope, presyncope, dizziness, orthopnea, PND, swelling or significant weight changes, acute bleeding, or claudication.  08/21/2023 -presents today for follow-up with his friend.  Patient is a difficult historian due to being hard of hearing.  Admits to generalized pains.  Episodes of chest pain have reduced since last office visit.  He believes his symptoms have improved some.  Friend says that his breathing has improved as he does not report being short of breath as often. Denies any recent chest pain, shortness of breath, palpitations, syncope, presyncope, dizziness, orthopnea, PND, swelling or significant weight changes, acute bleeding, or claudication. Has  been told by PCP that he has COPD per friend's report.   11/17/2023 -he presents today for follow-up with his wife.  He continues to admit to some episodes of chest pain, has had to take some nitroglycerin  for relief.  Wife is wanting to know about long-acting nitroglycerin 's-states she is on Imdur for this. Denies any  palpitations, syncope, presyncope, dizziness, orthopnea, PND, swelling or significant weight changes, acute bleeding, or claudication.  Admits to shortness of breath with exertion that is stable since previous office visit.  Wife and patient report that he is currently participating in pulmonary rehab, classes called [facility] as well as doing cardiac rehab.   ROS: Negative.  See HPI. 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  Studies Reviewed: Aaron Aas    EKG: EKG is not ordered today.  Echo 07/2023: 1. Left ventricular ejection fraction, by estimation, is 55 to 60%. The  left ventricle has normal function. The left ventricle has no regional  wall motion abnormalities. There is mild left ventricular hypertrophy.  Left ventricular diastolic parameters  are consistent with Grade I diastolic dysfunction (impaired relaxation).  The global longitudinal strain is indeterminate.   2. Right ventricular systolic function is normal. The right ventricular  size is normal. Tricuspid regurgitation signal is inadequate for assessing  PA pressure.   3. The mitral valve is normal in structure. No evidence of mitral valve  regurgitation. No evidence of mitral stenosis.   4. The aortic valve is tricuspid. Aortic valve regurgitation is mild. No  aortic stenosis is present.   Comparison(s): No prior  Echocardiogram.  Lexiscan  07/2023:   Findings are consistent with no ischemia. The study is low risk.   No ST deviation was noted. The ECG was negative for ischemia.   LV perfusion is normal.  No significant myocardial perfusion defects to indicate scar or ischemia in the setting of diaphragmatic  attenuation.   Left ventricular function is normal. Nuclear stress EF: 56%.  Lexiscan  12/2021:    Lexiscan  stress is electrically negative for ischemia   Myoview  scan shows normal perfusion   No ischemia or scar   LVEF calculated at 70%   Overall low risk study  Physical Exam:   VS:  BP 118/68   Pulse 74   Ht 6' 1 (1.854 m)   Wt 204 lb 9.6 oz (92.8 kg)   SpO2 93%   BMI 26.99 kg/m    Wt Readings from Last 3 Encounters:  11/17/23 204 lb 9.6 oz (92.8 kg)  08/21/23 212 lb (96.2 kg)  07/10/23 211 lb 6.4 oz (95.9 kg)    GEN: Well nourished, well developed in no acute distress NECK: No JVD; No carotid bruits CARDIAC: S1/S2, RRR, no murmurs, rubs, gallops RESPIRATORY:  Clear and diminished to auscultation without rales, wheezing or rhonchi  ABDOMEN: Soft, non-tender, non-distended EXTREMITIES:  No edema; No deformity   ASSESSMENT AND PLAN: .    Chest pain of uncertain etiology, DOE He continues to admit to exertional chest pain with dyspnea on exertion has been chronic over the past year, stable symptoms since previous office visit.  Reviewed recent NST that was low risk and reassuring.  Discussed medication management and medication options.  Consulted clinical pharmacist Joelene Murrain), who stated okay to begin Imdur 15 mg daily to improve his symptoms, will decrease losartan to 12.5 mg daily to prevent BP from dropping too low. Continue rest of medication regimen. Heart healthy diet and regular cardiovascular exercise encouraged. Care and ED precautions discussed.  Continue therapy as scheduled.   2. HTN Blood pressure stable. Discussed to monitor BP at home at least 2 hours after medications and sitting for 5-10 minutes.  Medication changes as outlined above.  Continue rest of medication regimen. Heart healthy diet and regular cardiovascular exercise encouraged.   3. HLD LDL 90 in November 2024.  Continue atorvastatin .  This is being managed by his PCP. Heart healthy diet and  regular cardiovascular exercise encouraged.   4. Hx of TIA's Denies any recent symptoms.  Continue current medication regimen. Continue to follow with PCP.   Dispo: Follow-up with me/APP in 4-6 weeks or sooner if any changes.  Signed, Lasalle Pointer, NP

## 2023-11-17 NOTE — Patient Instructions (Addendum)
 Medication Instructions:   Decrease Losartan to 12.5mg  daily  Begin Imdur 15mg  daily  Continue all other medications.     Labwork:  none  Testing/Procedures:  none  Follow-Up:  4-6 weeks   Any Other Special Instructions Will Be Listed Below (If Applicable).  BP log   If you need a refill on your cardiac medications before your next appointment, please call your pharmacy.

## 2023-11-22 ENCOUNTER — Telehealth: Payer: Self-pay | Admitting: Nurse Practitioner

## 2023-11-22 ENCOUNTER — Telehealth: Payer: Self-pay

## 2023-11-22 DIAGNOSIS — M5030 Other cervical disc degeneration, unspecified cervical region: Secondary | ICD-10-CM | POA: Diagnosis not present

## 2023-11-22 DIAGNOSIS — E782 Mixed hyperlipidemia: Secondary | ICD-10-CM | POA: Diagnosis not present

## 2023-11-22 DIAGNOSIS — G4700 Insomnia, unspecified: Secondary | ICD-10-CM | POA: Diagnosis not present

## 2023-11-22 DIAGNOSIS — E1169 Type 2 diabetes mellitus with other specified complication: Secondary | ICD-10-CM | POA: Diagnosis not present

## 2023-11-22 DIAGNOSIS — I10 Essential (primary) hypertension: Secondary | ICD-10-CM | POA: Diagnosis not present

## 2023-11-22 DIAGNOSIS — D72829 Elevated white blood cell count, unspecified: Secondary | ICD-10-CM | POA: Diagnosis not present

## 2023-11-22 DIAGNOSIS — R944 Abnormal results of kidney function studies: Secondary | ICD-10-CM | POA: Diagnosis not present

## 2023-11-22 DIAGNOSIS — E039 Hypothyroidism, unspecified: Secondary | ICD-10-CM | POA: Diagnosis not present

## 2023-11-22 DIAGNOSIS — R519 Headache, unspecified: Secondary | ICD-10-CM | POA: Diagnosis not present

## 2023-11-22 DIAGNOSIS — R634 Abnormal weight loss: Secondary | ICD-10-CM | POA: Diagnosis not present

## 2023-11-22 DIAGNOSIS — R079 Chest pain, unspecified: Secondary | ICD-10-CM | POA: Diagnosis not present

## 2023-11-22 MED ORDER — ISOSORBIDE MONONITRATE ER 30 MG PO TB24: 15.0000 mg | ORAL_TABLET | Freq: Every day | ORAL | 3 refills | Status: DC

## 2023-11-22 NOTE — Telephone Encounter (Signed)
 Pt's medication was sent to pt's pharmacy as requested. Confirmation received.

## 2023-11-22 NOTE — Telephone Encounter (Signed)
*  STAT* If patient is at the pharmacy, call can be transferred to refill team.   1. Which medications need to be refilled? (please list name of each medication and dose if known) isosorbide mononitrate (IMDUR) 30 MG 24 hr tablet    2. Would you like to learn more about the convenience, safety, & potential cost savings by using the [facility]?      3. Are you open to using the [facility] (Type [facility].  ).   4. Which pharmacy/location (including street and city if local pharmacy) is medication to be sent to? [facility] - [place], [place] - [address]    5. Do they need a 30 day or 90 day supply? 90 day

## 2023-11-22 NOTE — Telephone Encounter (Signed)
 Reviewed updated labs, baseline A1C when first engaging with patient was 8.3. Updated labs on 11/17/23 indicate an A1C of 6.7. Congratulated patient and caregiver on their efforts and encouraged them to continue to maintain good control of diabetes.

## 2023-11-24 ENCOUNTER — Telehealth: Payer: Self-pay | Admitting: Cardiology

## 2023-11-24 NOTE — Telephone Encounter (Signed)
Called patient and unable to leave VM.

## 2023-11-24 NOTE — Telephone Encounter (Signed)
 Pt c/o medication issue:  1. Name of Medication:   isosorbide mononitrate (IMDUR) 30 MG 24 hr tablet   2. How are you currently taking this medication (dosage and times per day)?     3. Are you having a reaction (difficulty breathing--STAT)?   4. What is your medication issue?   Caller Geneticist, molecular) stated patient took this medication earlier than prescribed and noted patient was not able to concentrate, was nauseous and his eyes were blurry.  Caller noted patient stated he was feeling fine prior to taking the medication.  Caller wants a call back to discuss next steps as patient is hard of hearing. Caller stated can leave voice mail.

## 2023-11-24 NOTE — Telephone Encounter (Signed)
 Patient informed and verbalized understanding of plan.

## 2023-11-24 NOTE — Telephone Encounter (Signed)
 Spoke with patient neighbor okay per patient DPR  She states yesterday he started the IMDUR he took both imdur and losartan at the same time a hour or so after he began being nauseated, dizzy, blurry vision patient does not check his vitals at home so it is unclear if this was happening  Patient did not take imdur today but he did take losartan and feels perfectly fine.

## 2023-12-13 ENCOUNTER — Ambulatory Visit: Attending: Nurse Practitioner | Admitting: Nurse Practitioner

## 2023-12-13 ENCOUNTER — Encounter: Payer: Self-pay | Admitting: Nurse Practitioner

## 2023-12-13 VITALS — BP 126/80 | HR 75 | Ht 73.0 in | Wt 201.2 lb

## 2023-12-13 DIAGNOSIS — I10 Essential (primary) hypertension: Secondary | ICD-10-CM | POA: Diagnosis not present

## 2023-12-13 DIAGNOSIS — R0609 Other forms of dyspnea: Secondary | ICD-10-CM

## 2023-12-13 DIAGNOSIS — I951 Orthostatic hypotension: Secondary | ICD-10-CM | POA: Diagnosis not present

## 2023-12-13 DIAGNOSIS — E785 Hyperlipidemia, unspecified: Secondary | ICD-10-CM | POA: Diagnosis not present

## 2023-12-13 DIAGNOSIS — Z8673 Personal history of transient ischemic attack (TIA), and cerebral infarction without residual deficits: Secondary | ICD-10-CM

## 2023-12-13 DIAGNOSIS — R079 Chest pain, unspecified: Secondary | ICD-10-CM | POA: Diagnosis not present

## 2023-12-13 MED ORDER — MIDODRINE HCL 5 MG PO TABS: 5.0000 mg | ORAL_TABLET | Freq: Two times a day (BID) | ORAL | 1 refills | Status: DC

## 2023-12-13 NOTE — Progress Notes (Signed)
 Cardiology Office Note:  .   Date:  12/13/2023 ID:  Andre [name], DOB [birth year], MRN [id number] PCP: [name], MD  [facility] Providers Cardiologist:  [name], MD    History of Present Illness: .   Andre [name] is a 78 y.o. male with a PMH of chest pain, hypertension, hyperlipidemia, type 2 diabetes, former smoker, past history of TIAs, COPD, OSA, GERD, who presents today for scheduled follow-up.  Last seen by Dr. Carrier Alvan on December 27, 2021.  Patient noted some chest pain with symptoms that presented to be mixed.  Due to multiple CAD risk factors, a exercise nuclear stress test was arranged to further evaluate.  Stress test was WNL.  07/10/2023 - Today he presents for 1 year follow-up with his friend.  He admits to chronic chest pain across his entire chest, seems to be triggered with high exertional activities, friend says he has to rest for symptoms to be relieved usually within 15-20 minutes. 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  Says symptoms seem to be worse more recently. He shares with me that he has in-laws staying with him that is causing him some stress, says symptoms seem to be worse during periods of emotional upset. Denies any palpitations, syncope, presyncope, dizziness, orthopnea, PND, swelling or significant weight changes, acute bleeding, or claudication.  08/21/2023 -presents today for follow-up with his friend.  Patient is a difficult historian due to being hard of hearing.  Admits to generalized pains.  Episodes of chest pain have reduced since last office visit.  He believes his symptoms have improved some.  Friend says that his breathing has improved as he does not report being short of breath as often. Denies any recent chest pain, shortness of breath, palpitations, syncope, presyncope, dizziness, orthopnea, PND, swelling or significant weight changes, acute bleeding, or claudication. Has been  told by PCP that he has COPD per friend's report.   11/17/2023 -he presents today for follow-up with his wife.  He continues to admit to some episodes of chest pain, has had to take some nitroglycerin  for relief.  Wife is wanting to know about long-acting nitroglycerin 's-states she is on Imdur  for this. Denies any  palpitations, syncope, presyncope, dizziness, orthopnea, PND, swelling or significant weight changes, acute bleeding, or claudication.  Admits to shortness of breath with exertion that is stable since previous office visit.  Wife and patient report that he is currently participating in pulmonary rehab, classes called [facility] as well as doing cardiac rehab.  12/13/2023 - Here for follow-up with his wife.  Says he did have 1 episode of chest pain while loading groceries into his car-says it was hot that day.  Took a nitroglycerin  that did not seem to subside his pain, then took a second tablet and said this helped.  Has not had any more issues since.  Could not tolerate Imdur  as this appears his dropped his BP too low.  He confirms to me he is only taking midodrine  once a day and continues to take losartan  -half a tablet daily.  He says that orthostatics were obtained at a previous doctor's office visit where his BP dropped 30 mmHg.  Currently denies any orthostatic symptoms. Denies any   palpitations, syncope, presyncope, dizziness, orthopnea, PND, swelling or significant weight changes, acute bleeding, or claudication. Breathing is stable.    ROS: Negative.  See HPI. 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  Echo 07/2023: 1. Left ventricular ejection fraction, by estimation, is 55 to 60%. The  left ventricle has normal function. The left ventricle has no regional  wall motion abnormalities. There is mild left ventricular hypertrophy.  Left ventricular diastolic parameters  are consistent with Grade I diastolic dysfunction  (impaired relaxation).  The global longitudinal strain is indeterminate.   2. Right ventricular systolic function is normal. The right ventricular  size is normal. Tricuspid regurgitation signal is inadequate for assessing  PA pressure.   3. The mitral valve is normal in structure. No evidence of mitral valve  regurgitation. No evidence of mitral stenosis.   4. The aortic valve is tricuspid. Aortic valve regurgitation is mild. No  aortic stenosis is present.   Comparison(s): No prior Echocardiogram.  Lexiscan  07/2023:   Findings are consistent with no ischemia. The study is low risk.   No ST deviation was noted. The ECG was negative for ischemia.   LV perfusion is normal.  No significant myocardial perfusion defects to indicate scar or ischemia in the setting of diaphragmatic attenuation.   Left ventricular function is normal. Nuclear stress EF: 56%.  Lexiscan  12/2021:    Lexiscan  stress is electrically negative for ischemia   Myoview  scan shows normal perfusion   No ischemia or scar   LVEF calculated at 70%   Overall low risk study  Physical Exam:   VS:  BP 126/80   Pulse 75   Ht 6' 1 (1.854 m)   Wt 201 lb 3.2 oz (91.3 kg)   SpO2 97%   BMI 26.55 kg/m    Wt Readings from Last 3 Encounters:  12/13/23 201 lb 3.2 oz (91.3 kg)  11/17/23 204 lb 9.6 oz (92.8 kg)  08/21/23 212 lb (96.2 kg)    GEN: Well nourished, well developed in no acute distress NECK: No JVD; No carotid bruits CARDIAC: S1/S2, RRR, no murmurs, rubs, gallops RESPIRATORY:  Clear and diminished to auscultation without rales, wheezing or rhonchi  ABDOMEN: Soft, non-tender, non-distended EXTREMITIES:  No edema; No deformity   ASSESSMENT AND PLAN: .    Chest pain of uncertain etiology, DOE He admits to one time episode of exertional chest pain while putting away groceries, also has dyspnea on exertion has been chronic over the past year, stable symptoms since previous office visit, exertional chest pain episode was  relieved with 2 tablets of nitroglycerin -see HPI.  No recurrences since then.  GDMT limited due to history of orthostatic hypotension-see below.  Reviewed most recent NST that was low risk and reassuring.  Discussed medication management and medication options.  Will stop losartan  and Imdur .  Cannot tolerate Imdur -see HPI.  Instructed him to take midodrine  5 mg 2 times a day instead of once a day.  Continue rest of medication regimen. Heart healthy diet and regular cardiovascular exercise encouraged. Care and ED precautions discussed.  Continue therapy as scheduled.   2. HTN, orthostatic hypotension Blood pressure stable, but appears was symptomatic while started on low-dose Imdur  -will discontinue this medicine along with losartan . Past self reported hx of orthostatic hypotension.  Instructed him to take midodrine  5 mg twice daily instead of once a day. Discussed to monitor BP at home at least 2 hours after medications and sitting for 5-10 minutes.  Medication changes as outlined above.  Continue rest of medication regimen. Heart healthy diet and regular cardiovascular exercise encouraged.   3. HLD Most recent LDL 66  11/2023.  Continue atorvastatin .  This is being managed by his PCP. Heart healthy diet and regular cardiovascular exercise encouraged.   4. Hx of TIA's Denies any recent symptoms.  Continue current medication regimen. Continue to follow with PCP.   Dispo: Follow-up with me/APP in 6 weeks or sooner if any changes.  Signed, Almarie Crate, NP

## 2023-12-13 NOTE — Patient Instructions (Signed)
 Medication Instructions:  Your physician has recommended you make the following change in your medication:  Please Increase Midodrine  to 5 Mg Twice daily  Please stop Losartan  and IMDUR    Labwork: None   Testing/Procedures: None   Follow-Up: Your physician recommends that you schedule a follow-up appointment in: 6 weeks   Any Other Special Instructions Will Be Listed Below (If Applicable).  If you need a refill on your cardiac medications before your next appointment, please call your pharmacy.

## 2024-01-16 DIAGNOSIS — E1140 Type 2 diabetes mellitus with diabetic neuropathy, unspecified: Secondary | ICD-10-CM | POA: Diagnosis not present

## 2024-01-16 DIAGNOSIS — L110 Acquired keratosis follicularis: Secondary | ICD-10-CM | POA: Diagnosis not present

## 2024-01-16 DIAGNOSIS — M79674 Pain in right toe(s): Secondary | ICD-10-CM | POA: Diagnosis not present

## 2024-01-16 DIAGNOSIS — M79675 Pain in left toe(s): Secondary | ICD-10-CM | POA: Diagnosis not present

## 2024-01-16 DIAGNOSIS — M79671 Pain in right foot: Secondary | ICD-10-CM | POA: Diagnosis not present

## 2024-01-16 DIAGNOSIS — M79672 Pain in left foot: Secondary | ICD-10-CM | POA: Diagnosis not present

## 2024-01-16 DIAGNOSIS — I739 Peripheral vascular disease, unspecified: Secondary | ICD-10-CM | POA: Diagnosis not present

## 2024-02-02 ENCOUNTER — Encounter: Payer: Self-pay | Admitting: Nurse Practitioner

## 2024-02-02 ENCOUNTER — Ambulatory Visit: Attending: Nurse Practitioner | Admitting: Nurse Practitioner

## 2024-02-02 VITALS — BP 116/62 | HR 72 | Ht 73.0 in | Wt 202.4 lb

## 2024-02-02 DIAGNOSIS — E785 Hyperlipidemia, unspecified: Secondary | ICD-10-CM

## 2024-02-02 DIAGNOSIS — I951 Orthostatic hypotension: Secondary | ICD-10-CM | POA: Diagnosis not present

## 2024-02-02 DIAGNOSIS — R079 Chest pain, unspecified: Secondary | ICD-10-CM | POA: Diagnosis not present

## 2024-02-02 DIAGNOSIS — I10 Essential (primary) hypertension: Secondary | ICD-10-CM | POA: Diagnosis not present

## 2024-02-02 DIAGNOSIS — R0609 Other forms of dyspnea: Secondary | ICD-10-CM | POA: Diagnosis not present

## 2024-02-02 DIAGNOSIS — Z8673 Personal history of transient ischemic attack (TIA), and cerebral infarction without residual deficits: Secondary | ICD-10-CM | POA: Diagnosis not present

## 2024-02-02 NOTE — Patient Instructions (Addendum)

## 2024-02-02 NOTE — Progress Notes (Signed)
 Cardiology Office Note:  .   Date:  02/02/2024 ID:  Andre [name], DOB [birth year], MRN [id number] PCP: [name], MD  [facility] Providers Cardiologist:  [name], MD    History of Present Illness: .   Andre [name] is a 78 y.o. male with a PMH of chest pain, hypertension, hyperlipidemia, type 2 diabetes, former smoker, past history of TIAs, COPD, OSA, GERD, who presents today for scheduled follow-up.  Last seen by Dr. Carrier Alvan on December 27, 2021.  Patient noted some chest pain with symptoms that presented to be mixed.  Due to multiple CAD risk factors, a exercise nuclear stress test was arranged to further evaluate.  Stress test was WNL.  07/10/2023 - Today he presents for 1 year follow-up with his friend.  He admits to chronic chest pain across his entire chest, seems to be triggered with high exertional activities, friend says he has to rest for symptoms to be relieved usually within 15-20 minutes. 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  Says symptoms seem to be worse more recently. He shares with me that he has in-laws staying with him that is causing him some stress, says symptoms seem to be worse during periods of emotional upset. Denies any palpitations, syncope, presyncope, dizziness, orthopnea, PND, swelling or significant weight changes, acute bleeding, or claudication.  08/21/2023 -presents today for follow-up with his friend.  Patient is a difficult historian due to being hard of hearing.  Admits to generalized pains.  Episodes of chest pain have reduced since last office visit.  He believes his symptoms have improved some.  Friend says that his breathing has improved as he does not report being short of breath as often. Denies any recent chest pain, shortness of breath, palpitations, syncope, presyncope, dizziness, orthopnea, PND, swelling or significant weight changes, acute bleeding, or claudication. Has  been told by PCP that he has COPD per friend's report.   11/17/2023 -he presents today for follow-up with his wife.  He continues to admit to some episodes of chest pain, has had to take some nitroglycerin  for relief.  Wife is wanting to know about long-acting nitroglycerin 's-states she is on Imdur  for this. Denies any  palpitations, syncope, presyncope, dizziness, orthopnea, PND, swelling or significant weight changes, acute bleeding, or claudication.  Admits to shortness of breath with exertion that is stable since previous office visit.  Wife and patient report that he is currently participating in pulmonary rehab, classes called [facility] as well as doing cardiac rehab.  12/13/2023 - Here for follow-up with his wife.  Says he did have 1 episode of chest pain while loading groceries into his car-says it was hot that day.  Took a nitroglycerin  that did not seem to subside his pain, then took a second tablet and said this helped.  Has not had any more issues since.  Could not tolerate Imdur  as this appears his dropped his BP too low.  He confirms to me he is only taking midodrine  once a day and continues to take losartan  -half a tablet daily.  He says that orthostatics were obtained at a previous doctor's office visit where his BP dropped 30 mmHg.  Currently denies any orthostatic symptoms. Denies any   palpitations, syncope, presyncope, dizziness, orthopnea, PND, swelling or significant weight changes, acute bleeding, or claudication. Breathing is stable.   02/02/2024- Here for follow-up with his significant other. Tells me he is overall doing  better since I have last seen him. He tells me he has had 3 episodes of CP since last visit, but episodes never got severe. Tells me he is breathing well, feels pretty good. Enjoying doing pulmonary rehab at [facility]. Denies any shortness of breath, palpitations, syncope, presyncope, dizziness, orthopnea, PND, swelling or significant weight changes, acute bleeding, or  claudication.   ROS: Negative.  See HPI. 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  Echo 07/2023: 1. Left ventricular ejection fraction, by estimation, is 55 to 60%. The  left ventricle has normal function. The left ventricle has no regional  wall motion abnormalities. There is mild left ventricular hypertrophy.  Left ventricular diastolic parameters  are consistent with Grade I diastolic dysfunction (impaired relaxation).  The global longitudinal strain is indeterminate.   2. Right ventricular systolic function is normal. The right ventricular  size is normal. Tricuspid regurgitation signal is inadequate for assessing  PA pressure.   3. The mitral valve is normal in structure. No evidence of mitral valve  regurgitation. No evidence of mitral stenosis.   4. The aortic valve is tricuspid. Aortic valve regurgitation is mild. No  aortic stenosis is present.   Comparison(s): No prior Echocardiogram.  Lexiscan  07/2023:   Findings are consistent with no ischemia. The study is low risk.   No ST deviation was noted. The ECG was negative for ischemia.   LV perfusion is normal.  No significant myocardial perfusion defects to indicate scar or ischemia in the setting of diaphragmatic attenuation.   Left ventricular function is normal. Nuclear stress EF: 56%.  Lexiscan  12/2021:    Lexiscan  stress is electrically negative for ischemia   Myoview  scan shows normal perfusion   No ischemia or scar   LVEF calculated at 70%   Overall low risk study  Physical Exam:   VS:  BP 116/62   Pulse 72   Ht 6' 1 (1.854 m)   Wt 202 lb 6.4 oz (91.8 kg)   SpO2 95%   BMI 26.70 kg/m    Wt Readings from Last 3 Encounters:  02/02/24 202 lb 6.4 oz (91.8 kg)  12/13/23 201 lb 3.2 oz (91.3 kg)  11/17/23 204 lb 9.6 oz (92.8 kg)    GEN: Well nourished, well developed in no acute distress NECK: No JVD; No carotid bruits CARDIAC: S1/S2, RRR, no murmurs,  rubs, gallops RESPIRATORY:  Clear and diminished to auscultation without rales, wheezing or rhonchi  ABDOMEN: Soft, non-tender, non-distended EXTREMITIES:  No edema; No deformity   ASSESSMENT AND PLAN: .    Chest pain of uncertain etiology, DOE Improved symptoms since last office visit, breathing has improved.  GDMT limited due to history of orthostatic hypotension-see below.  Reviewed most recent NST that was low risk and reassuring.  Discussed medication management and medication options. Cannot tolerate Imdur -see HPI.  Will continue to monitor for now per shared medical decision with patient today. Continue rest of medication regimen. Heart healthy diet and regular cardiovascular exercise encouraged. Care and ED precautions discussed.  Continue therapy as scheduled.   2. HTN, orthostatic hypotension Blood pressure stable. Discussed to monitor BP at home at least 2 hours after medications and sitting for 5-10 minutes. Continue current medication regimen. Heart healthy diet and regular cardiovascular exercise encouraged.   3. HLD Most recent LDL 66 11/2023.  Continue atorvastatin .  This is being managed by his PCP. Heart healthy diet and regular cardiovascular exercise  encouraged.   4. Hx of TIA's Denies any recent symptoms.  Continue current medication regimen. Continue to follow with PCP.  I spent a total duration of 30 minutes reviewing prior notes, reviewing outside records including  labs, face-to-face counseling of medical condition, pathophysiology, evaluation, management, and documenting the findings in the note.   Dispo: Follow-up with MD/APP in 3 months or sooner if any changes.  Signed, Almarie Crate, NP

## 2024-02-09 DIAGNOSIS — Z23 Encounter for immunization: Secondary | ICD-10-CM | POA: Diagnosis not present

## 2024-03-08 ENCOUNTER — Encounter: Payer: Self-pay | Admitting: *Deleted

## 2024-03-08 NOTE — Progress Notes (Signed)
 ABRAHIM SARGENT                                          MRN: [id number]   03/08/2024   The [facility] Quality Team Specialist reviewed this patient medical record for the purposes of chart review for care gap closure. The following were reviewed: chart review for care gap closure-kidney health evaluation for diabetes:eGFR  and uACR.    [facility] Quality Team

## 2024-03-14 DIAGNOSIS — E782 Mixed hyperlipidemia: Secondary | ICD-10-CM | POA: Diagnosis not present

## 2024-03-14 DIAGNOSIS — E1169 Type 2 diabetes mellitus with other specified complication: Secondary | ICD-10-CM | POA: Diagnosis not present

## 2024-03-14 DIAGNOSIS — E039 Hypothyroidism, unspecified: Secondary | ICD-10-CM | POA: Diagnosis not present

## 2024-03-20 ENCOUNTER — Encounter: Payer: Self-pay | Admitting: Nurse Practitioner

## 2024-03-20 DIAGNOSIS — G4700 Insomnia, unspecified: Secondary | ICD-10-CM | POA: Diagnosis not present

## 2024-03-20 DIAGNOSIS — Z0000 Encounter for general adult medical examination without abnormal findings: Secondary | ICD-10-CM | POA: Diagnosis not present

## 2024-03-20 DIAGNOSIS — Z0001 Encounter for general adult medical examination with abnormal findings: Secondary | ICD-10-CM | POA: Diagnosis not present

## 2024-03-20 DIAGNOSIS — G43909 Migraine, unspecified, not intractable, without status migrainosus: Secondary | ICD-10-CM | POA: Insufficient documentation

## 2024-03-20 DIAGNOSIS — I10 Essential (primary) hypertension: Secondary | ICD-10-CM | POA: Diagnosis not present

## 2024-03-20 DIAGNOSIS — E782 Mixed hyperlipidemia: Secondary | ICD-10-CM | POA: Diagnosis not present

## 2024-03-20 DIAGNOSIS — M5030 Other cervical disc degeneration, unspecified cervical region: Secondary | ICD-10-CM | POA: Diagnosis not present

## 2024-03-20 DIAGNOSIS — E039 Hypothyroidism, unspecified: Secondary | ICD-10-CM | POA: Diagnosis not present

## 2024-03-20 DIAGNOSIS — R634 Abnormal weight loss: Secondary | ICD-10-CM | POA: Diagnosis not present

## 2024-03-20 DIAGNOSIS — R079 Chest pain, unspecified: Secondary | ICD-10-CM | POA: Diagnosis not present

## 2024-03-20 DIAGNOSIS — E1169 Type 2 diabetes mellitus with other specified complication: Secondary | ICD-10-CM | POA: Diagnosis not present

## 2024-03-20 DIAGNOSIS — R519 Headache, unspecified: Secondary | ICD-10-CM | POA: Diagnosis not present

## 2024-03-26 DIAGNOSIS — T2104XA Burn of unspecified degree of lower back, initial encounter: Secondary | ICD-10-CM | POA: Diagnosis not present

## 2024-03-26 DIAGNOSIS — T300 Burn of unspecified body region, unspecified degree: Secondary | ICD-10-CM | POA: Diagnosis not present

## 2024-03-29 ENCOUNTER — Ambulatory Visit: Payer: Self-pay | Admitting: Nurse Practitioner

## 2024-04-01 ENCOUNTER — Other Ambulatory Visit (HOSPITAL_COMMUNITY): Payer: Self-pay

## 2024-04-01 ENCOUNTER — Telehealth: Payer: Self-pay | Admitting: Pharmacy Technician

## 2024-04-01 NOTE — Telephone Encounter (Signed)
 Hi, I was given this patient but jill last helped with their januvia. I was told you are doing the medication assistance now for dr zach hall's office. Can you please call Bruna at [phone] and help with the renewal of januvia? Thank you

## 2024-05-06 ENCOUNTER — Other Ambulatory Visit (HOSPITAL_COMMUNITY): Payer: Self-pay | Admitting: Family Medicine

## 2024-05-06 DIAGNOSIS — R109 Unspecified abdominal pain: Secondary | ICD-10-CM

## 2024-05-07 ENCOUNTER — Ambulatory Visit: Attending: Nurse Practitioner | Admitting: Nurse Practitioner

## 2024-05-07 ENCOUNTER — Encounter: Payer: Self-pay | Admitting: Nurse Practitioner

## 2024-05-07 VITALS — BP 124/70 | HR 88 | Ht 73.0 in | Wt 199.6 lb

## 2024-05-07 DIAGNOSIS — R079 Chest pain, unspecified: Secondary | ICD-10-CM

## 2024-05-07 DIAGNOSIS — Z8673 Personal history of transient ischemic attack (TIA), and cerebral infarction without residual deficits: Secondary | ICD-10-CM

## 2024-05-07 DIAGNOSIS — R0609 Other forms of dyspnea: Secondary | ICD-10-CM | POA: Diagnosis not present

## 2024-05-07 DIAGNOSIS — I10 Essential (primary) hypertension: Secondary | ICD-10-CM

## 2024-05-07 DIAGNOSIS — I951 Orthostatic hypotension: Secondary | ICD-10-CM

## 2024-05-07 DIAGNOSIS — E785 Hyperlipidemia, unspecified: Secondary | ICD-10-CM

## 2024-05-07 MED ORDER — RANOLAZINE ER 500 MG PO TB12: 500.0000 mg | ORAL_TABLET | Freq: Two times a day (BID) | ORAL | 5 refills | Status: AC

## 2024-05-07 NOTE — Patient Instructions (Addendum)
 Medication Instructions:  Your physician has recommended you make the following change in your medication:  Start taking Ranexa 500 mg twice daily Continue taking all other medications as prescribed   Labwork: None  Testing/Procedures: None  Follow-Up: Your physician recommends that you schedule a follow-up appointment in: 1 week Nurse visit EKG, 4-6 week follow up  Any Other Special Instructions Will Be Listed Below (If Applicable). Please also update us  how well you are tolerating Ranexa by calling the office   Thank you for choosing [facility]!     If you need a refill on your cardiac medications before your next appointment, please call your pharmacy.

## 2024-05-07 NOTE — Progress Notes (Unsigned)
 Cardiology Office Note:  .   Date:  02/02/2024 ID:  Andre [name], DOB 1945-07-30, MRN [id number] PCP: [name], Andre [name]  [facility] Providers Cardiologist:  [name], Andre [name]    History of Present Illness: .   Andre [name] is a 78 y.o. male with a PMH of chest pain, hypertension, hyperlipidemia, type 2 diabetes, former smoker, past history of TIAs, COPD, OSA, GERD, who presents today for scheduled follow-up.  Last seen by Dr. Carrier Alvan on December 27, 2021.  Patient noted some chest pain with symptoms that presented to be mixed.  Due to multiple CAD risk factors, a exercise nuclear stress test was arranged to further evaluate.  Stress test was WNL.  07/10/2023 - Today he presents for 1 year follow-up with his friend.  He admits to chronic chest pain across his entire chest, seems to be triggered with high exertional activities, friend says he has to rest for symptoms to be relieved usually within 15-20 minutes. 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  Says symptoms seem to be worse more recently. He shares with me that he has in-laws staying with him that is causing him some stress, says symptoms seem to be worse during periods of emotional upset. Denies any palpitations, syncope, presyncope, dizziness, orthopnea, PND, swelling or significant weight changes, acute bleeding, or claudication.  08/21/2023 -presents today for follow-up with his friend.  Patient is a difficult historian due to being hard of hearing.  Admits to generalized pains.  Episodes of chest pain have reduced since last office visit.  He believes his symptoms have improved some.  Friend says that his breathing has improved as he does not report being short of breath as often. Denies any recent chest pain, shortness of breath, palpitations, syncope, presyncope, dizziness, orthopnea, PND, swelling or significant weight changes, acute bleeding, or claudication. Has  been told by PCP that he has COPD per friend's report.   11/17/2023 -he presents today for follow-up with his wife.  He continues to admit to some episodes of chest pain, has had to take some nitroglycerin  for relief.  Wife is wanting to know about long-acting nitroglycerin 's-states she is on Imdur  for this. Denies any  palpitations, syncope, presyncope, dizziness, orthopnea, PND, swelling or significant weight changes, acute bleeding, or claudication.  Admits to shortness of breath with exertion that is stable since previous office visit.  Wife and patient report that he is currently participating in pulmonary rehab, classes called [facility] as well as doing cardiac rehab.  12/13/2023 - Here for follow-up with his wife.  Says he did have 1 episode of chest pain while loading groceries into his car-says it was hot that day.  Took a nitroglycerin  that did not seem to subside his pain, then took a second tablet and said this helped.  Has not had any more issues since.  Could not tolerate Imdur  as this appears his dropped his BP too low.  He confirms to me he is only taking midodrine  once a day and continues to take losartan  -half a tablet daily.  He says that orthostatics were obtained at a previous doctor's office visit where his BP dropped 30 mmHg.  Currently denies any orthostatic symptoms. Denies any   palpitations, syncope, presyncope, dizziness, orthopnea, PND, swelling or significant weight changes, acute bleeding, or claudication. Breathing is stable.   02/02/2024- Here for follow-up with his significant other. Tells me he is overall doing  better since I have last seen him. He tells me he has had 3 episodes of CP since last visit, but episodes never got severe. Tells me he is breathing well, feels pretty good. Enjoying doing pulmonary rehab at [facility]. Denies any shortness of breath, palpitations, syncope, presyncope, dizziness, orthopnea, PND, swelling or significant weight changes, acute bleeding, or  claudication.   Cardiac cath early 90's....   No stent...    ROS: Negative.  See HPI. 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  Studies Reviewed: SABRA    EKG: EKG is not ordered today.  Echo 07/2023: 1. Left ventricular ejection fraction, by estimation, is 55 to 60%. The  left ventricle has normal function. The left ventricle has no regional  wall motion abnormalities. There is mild left ventricular hypertrophy.  Left ventricular diastolic parameters  are consistent with Grade I diastolic dysfunction (impaired relaxation).  The global longitudinal strain is indeterminate.   2. Right ventricular systolic function is normal. The right ventricular  size is normal. Tricuspid regurgitation signal is inadequate for assessing  PA pressure.   3. The mitral valve is normal in structure. No evidence of mitral valve  regurgitation. No evidence of mitral stenosis.   4. The aortic valve is tricuspid. Aortic valve regurgitation is mild. No  aortic stenosis is present.   Comparison(s): No prior Echocardiogram.  Lexiscan  07/2023:   Findings are consistent with no ischemia. The study is low risk.   No ST deviation was noted. The ECG was negative for ischemia.   LV perfusion is normal.  No significant myocardial perfusion defects to indicate scar or ischemia in the setting of diaphragmatic attenuation.   Left ventricular function is normal. Nuclear stress EF: 56%.  Lexiscan  12/2021:    Lexiscan  stress is electrically negative for ischemia   Myoview  scan shows normal perfusion   No ischemia or scar   LVEF calculated at 70%   Overall low risk study  Physical Exam:   VS:  There were no vitals taken for this visit.   Wt Readings from Last 3 Encounters:  02/02/24 202 lb 6.4 oz (91.8 kg)  12/13/23 201 lb 3.2 oz (91.3 kg)  11/17/23 204 lb 9.6 oz (92.8 kg)    GEN: Well nourished, well developed in no acute distress NECK: No JVD; No carotid bruits CARDIAC: S1/S2, RRR, no murmurs, rubs,  gallops RESPIRATORY:  Clear and diminished to auscultation without rales, wheezing or rhonchi  ABDOMEN: Soft, non-tender, non-distended EXTREMITIES:  No edema; No deformity   ASSESSMENT AND PLAN: .    Chest pain of uncertain etiology, DOE Improved symptoms since last office visit, breathing has improved.  GDMT limited due to history of orthostatic hypotension-see below.  Reviewed most recent NST that was low risk and reassuring.  Discussed medication management and medication options. Cannot tolerate Imdur -see HPI.  Will continue to monitor for now per shared medical decision with patient today. Continue rest of medication regimen. Heart healthy diet and regular cardiovascular exercise encouraged. Care and ED precautions discussed.  Continue therapy as scheduled.   2. HTN, orthostatic hypotension Blood pressure stable. Discussed to monitor BP at home at least 2 hours after medications and sitting for 5-10 minutes. Continue current medication regimen. Heart healthy diet and regular cardiovascular exercise encouraged.   3. HLD Most recent LDL 66 11/2023.  Continue atorvastatin .  This is being managed by his PCP. Heart healthy diet and regular cardiovascular exercise encouraged.   4. Hx of TIA's Denies any recent symptoms.  Continue  current medication regimen. Continue to follow with PCP.  I spent a total duration of 30 minutes reviewing prior notes, reviewing outside records including  labs, face-to-face counseling of medical condition, pathophysiology, evaluation, management, and documenting the findings in the note.   Dispo: Follow-up with Andre [name]/APP in 3 months or sooner if any changes.  Signed, Almarie Crate, NP

## 2024-05-09 ENCOUNTER — Ambulatory Visit (HOSPITAL_COMMUNITY): Admission: RE | Admit: 2024-05-09 | Discharge: 2024-05-09 | Attending: Family Medicine

## 2024-05-09 DIAGNOSIS — R109 Unspecified abdominal pain: Secondary | ICD-10-CM | POA: Insufficient documentation

## 2024-05-14 ENCOUNTER — Other Ambulatory Visit (HOSPITAL_COMMUNITY)

## 2024-05-17 ENCOUNTER — Telehealth: Payer: Self-pay | Admitting: Cardiology

## 2024-05-17 NOTE — Telephone Encounter (Signed)
 Pam is calling in to reschedule the nurse visit on 12/15 for the patient. Please advise

## 2024-05-20 ENCOUNTER — Ambulatory Visit

## 2024-05-23 ENCOUNTER — Ambulatory Visit

## 2024-05-28 ENCOUNTER — Emergency Department (HOSPITAL_COMMUNITY)
Admission: EM | Admit: 2024-05-28 | Discharge: 2024-05-28 | Disposition: A | Discharge status: Home / Self Care | Attending: Emergency Medicine | Admitting: Emergency Medicine

## 2024-05-28 ENCOUNTER — Emergency Department (HOSPITAL_COMMUNITY)

## 2024-05-28 ENCOUNTER — Other Ambulatory Visit: Payer: Self-pay

## 2024-05-28 ENCOUNTER — Encounter (HOSPITAL_COMMUNITY): Payer: Self-pay

## 2024-05-28 DIAGNOSIS — R2241 Localized swelling, mass and lump, right lower limb: Secondary | ICD-10-CM | POA: Diagnosis not present

## 2024-05-28 DIAGNOSIS — K5792 Diverticulitis of intestine, part unspecified, without perforation or abscess without bleeding: Secondary | ICD-10-CM | POA: Diagnosis not present

## 2024-05-28 DIAGNOSIS — Z7984 Long term (current) use of oral hypoglycemic drugs: Secondary | ICD-10-CM | POA: Diagnosis not present

## 2024-05-28 DIAGNOSIS — Z7982 Long term (current) use of aspirin: Secondary | ICD-10-CM | POA: Insufficient documentation

## 2024-05-28 DIAGNOSIS — Z79899 Other long term (current) drug therapy: Secondary | ICD-10-CM | POA: Diagnosis not present

## 2024-05-28 DIAGNOSIS — D72829 Elevated white blood cell count, unspecified: Secondary | ICD-10-CM | POA: Insufficient documentation

## 2024-05-28 DIAGNOSIS — R1031 Right lower quadrant pain: Secondary | ICD-10-CM | POA: Diagnosis present

## 2024-05-28 LAB — COMPREHENSIVE METABOLIC PANEL WITH GFR
ALT: 20 U/L (ref 0–44)
AST: 22 U/L (ref 15–41)
Albumin: 4.2 g/dL (ref 3.5–5.0)
Alkaline Phosphatase: 87 U/L (ref 38–126)
Anion gap: 9 (ref 5–15)
BUN: 24 mg/dL — ABNORMAL HIGH (ref 8–23)
CO2: 25 mmol/L (ref 22–32)
Calcium: 9.1 mg/dL (ref 8.9–10.3)
Chloride: 104 mmol/L (ref 98–111)
Creatinine, Ser: 1.13 mg/dL (ref 0.61–1.24)
GFR, Estimated: >60 mL/min
Glucose, Bld: 119 mg/dL — ABNORMAL HIGH (ref 70–99)
Potassium: 5.3 mmol/L — ABNORMAL HIGH (ref 3.5–5.1)
Sodium: 139 mmol/L (ref 135–145)
Total Bilirubin: 0.4 mg/dL (ref 0.0–1.2)
Total Protein: 7.8 g/dL (ref 6.5–8.1)

## 2024-05-28 LAB — CBC WITH DIFFERENTIAL/PLATELET
Abs Immature Granulocytes: 0.07 K/uL (ref 0.00–0.07)
Basophils Absolute: 0.1 K/uL (ref 0.0–0.1)
Basophils Relative: 0 %
Eosinophils Absolute: 0.2 K/uL (ref 0.0–0.5)
Eosinophils Relative: 1 %
HCT: 48.1 % (ref 39.0–52.0)
Hemoglobin: 16.1 g/dL (ref 13.0–17.0)
Immature Granulocytes: 0 %
Lymphocytes Relative: 14 %
Lymphs Abs: 2.3 K/uL (ref 0.7–4.0)
MCH: 32.5 pg (ref 26.0–34.0)
MCHC: 33.5 g/dL (ref 30.0–36.0)
MCV: 97.2 fL (ref 80.0–100.0)
Monocytes Absolute: 1.6 K/uL — ABNORMAL HIGH (ref 0.1–1.0)
Monocytes Relative: 10 %
Neutro Abs: 11.5 K/uL — ABNORMAL HIGH (ref 1.7–7.7)
Neutrophils Relative %: 75 %
Platelets: 349 K/uL (ref 150–400)
RBC: 4.95 MIL/uL (ref 4.22–5.81)
RDW: 13.7 % (ref 11.5–15.5)
WBC: 15.7 K/uL — ABNORMAL HIGH (ref 4.0–10.5)
nRBC: 0 % (ref 0.0–0.2)

## 2024-05-28 LAB — MAGNESIUM: Magnesium: 1.7 mg/dL (ref 1.7–2.4)

## 2024-05-28 LAB — LIPASE, BLOOD: Lipase: <10 U/L — ABNORMAL LOW (ref 11–51)

## 2024-05-28 MED ORDER — AMOXICILLIN-POT CLAVULANATE 875-125 MG PO TABS: 1.0000 | ORAL_TABLET | Freq: Two times a day (BID) | ORAL | 0 refills | Status: DC

## 2024-05-28 MED ORDER — AMOXICILLIN-POT CLAVULANATE 875-125 MG PO TABS
1.0000 | ORAL_TABLET | Freq: Once | ORAL | Status: AC
  Administered 2024-05-28: 1 via ORAL
  Filled 2024-05-28: qty 1

## 2024-05-28 MED ORDER — LACTATED RINGERS IV BOLUS
1000.0000 mL | Freq: Once | INTRAVENOUS | Status: AC
  Administered 2024-05-28: 1000 mL via INTRAVENOUS

## 2024-05-28 MED ORDER — IOHEXOL 300 MG/ML  SOLN
100.0000 mL | Freq: Once | INTRAMUSCULAR | Status: AC | PRN
  Administered 2024-05-28: 100 mL via INTRAVENOUS

## 2024-05-28 NOTE — ED Notes (Signed)
 Pt transported to CT ?

## 2024-05-28 NOTE — Discharge Instructions (Signed)
 Your CT scan shows you have an inflamed colon called diverticulitis.  We are putting you on antibiotics.  You were given your first dose in the emergency department.  If you develop fever, new or worsening or uncontrolled pain, bloody diarrhea, or any other new/concerning symptoms then return to the ER.

## 2024-05-28 NOTE — ED Provider Notes (Signed)
 " [facility] AT [facility] Provider Note   CSN: [id number] Arrival date & time: 05/28/24  9445     Patient presents with: Flank Pain   Andre [name] is a 78 y.o. male.   HPI 78 year old male presents with right flank pain.  He endorses he is at 4-5 weeks of pain and swelling to his right flank.  He does not remember any type of trauma.  Pain has been constant.  It is worse with certain movements.  He has been feeling generally weak since this started as well.  No specific leg weakness.  No leg numbness or incontinence.  He has had some vomiting since last night and for about a week he has been having watery diarrhea without blood.  He denies any fevers or chest pain/shortness of breath.  He is now having pain in his abdomen and feels like it is swollen.  He has seen his PCP and had an abdominal ultrasound that showed a fatty liver but no other obvious findings.  At rest his pain is not too bad but any movement makes it worse.  Prior to Admission medications  Medication Sig Start Date End Date Taking? Authorizing Provider  amoxicillin -clavulanate (AUGMENTIN ) 875-125 MG tablet Take 1 tablet by mouth every 12 (twelve) hours. 05/28/24  Yes Freddi Hamilton, MD  aspirin  325 MG tablet Take 325 mg by mouth daily.    [provider]  atorvastatin  (LIPITOR) 40 MG tablet Take 40 mg by mouth daily. 12/17/21   [provider]  celecoxib (CELEBREX) 200 MG capsule Take 200 mg by mouth daily. 12/16/21   [provider]  diazepam (VALIUM) 5 MG tablet Take 5 mg by mouth as needed. 04/21/22   [provider]  DULoxetine  (CYMBALTA ) 30 MG capsule Take 1 capsule (30 mg total) by mouth daily. 08/08/22   Jaffe, Adam R, DO  JANUVIA 100 MG tablet Take 100 mg by mouth daily. 08/30/19   [provider]  levothyroxine (SYNTHROID) 50 MCG tablet Take 50 mcg by mouth daily. 04/11/24   [provider]  metFORMIN (GLUCOPHAGE) 500 MG tablet Take  500 mg by mouth 2 (two) times daily. Patient taking differently: Take 500 mg by mouth 3 (three) times daily. 09/16/19   [provider]  midodrine  (PROAMATINE ) 5 MG tablet Take 1 tablet (5 mg total) by mouth 2 (two) times daily with a meal. 12/13/23   Miriam Norris, NP  nitroGLYCERIN  (NITROSTAT ) 0.4 MG SL tablet Place 1 tablet (0.4 mg total) under the tongue every 5 (five) minutes as needed for chest pain. 07/10/23 11/16/24  Miriam Norris, NP  omeprazole  (PRILOSEC) 20 MG capsule TAKE 1 CAPSULE BY MOUTH ONCE A DAY. 08/08/13   Bari Theodoro FALCON, MD  ranolazine  (RANEXA ) 500 MG 12 hr tablet Take 1 tablet (500 mg total) by mouth 2 (two) times daily. 05/07/24   Miriam Norris, NP  tiZANidine  (ZANAFLEX ) 2 MG tablet take 1 tablet by mouth at bedtime. 11/22/22   Skeet Juliene SAUNDERS, DO  traMADol  (ULTRAM ) 50 MG tablet Take 1 tablet (50 mg total) by mouth every 6 (six) hours as needed. 05/27/20   Zammit, Joseph, MD    Allergies: Patient has no known allergies.    Review of Systems  Constitutional:  Negative for fever.  Gastrointestinal:  Positive for abdominal pain, diarrhea, nausea and vomiting.  Genitourinary:  Positive for flank pain. Negative for dysuria.  Neurological:  Positive for weakness.    Updated Vital Signs BP 122/66  Pulse 75   Temp 97.8 F (36.6 C) (Oral)   Resp 18   Ht 6' 1 (1.854 m)   Wt 90.3 kg   SpO2 93%   BMI 26.25 kg/m   Physical Exam Vitals and nursing note reviewed.  Constitutional:      Appearance: He is well-developed.  HENT:     Head: Normocephalic and atraumatic.     Mouth/Throat:     Mouth: Mucous membranes are dry.  Cardiovascular:     Rate and Rhythm: Normal rate and regular rhythm.     Pulses:          Posterior tibial pulses are 2+ on the right side and 2+ on the left side.     Heart sounds: Normal heart sounds.  Pulmonary:     Effort: Pulmonary effort is normal.     Breath sounds: Normal breath sounds. No wheezing.  Abdominal:     Palpations:  Abdomen is soft.     Tenderness: There is abdominal tenderness in the right lower quadrant and left lower quadrant.  Musculoskeletal:       Arms:  Skin:    General: Skin is warm and dry.  Neurological:     Mental Status: He is alert.     (all labs ordered are listed, but only abnormal results are displayed) Labs Reviewed  CBC WITH DIFFERENTIAL/PLATELET - Abnormal; Notable for the following components:      Result Value   WBC 15.7 (*)    Neutro Abs 11.5 (*)    Monocytes Absolute 1.6 (*)    All other components within normal limits  LIPASE, BLOOD - Abnormal; Notable for the following components:   Lipase <10 (*)    All other components within normal limits  COMPREHENSIVE METABOLIC PANEL WITH GFR - Abnormal; Notable for the following components:   Potassium 5.3 (*)    Glucose, Bld 119 (*)    BUN 24 (*)    All other components within normal limits  MAGNESIUM  URINALYSIS, ROUTINE W REFLEX MICROSCOPIC    EKG: None  Radiology: CT ABDOMEN PELVIS W CONTRAST Result Date: 05/28/2024 EXAM: CT ABDOMEN AND PELVIS WITH CONTRAST 05/28/2024 09:28:01 AM TECHNIQUE: CT of the abdomen and pelvis was performed with the administration of 100 mL of iohexol  (OMNIPAQUE ) 300 MG/ML solution. Multiplanar reformatted images are provided for review. Automated exposure control, iterative reconstruction, and/or weight-based adjustment of the mA/kV was utilized to reduce the radiation dose to as low as reasonably achievable. COMPARISON: 05/09/2024, 09/28/2019 CLINICAL HISTORY: flank swelling/pain, RLQ pain FINDINGS: LOWER CHEST: Posterior bibasilar dependent atelectasis. Trace pericardial effusion. LIVER: The liver is unremarkable. GALLBLADDER AND BILE DUCTS: Cholecystectomy. No biliary ductal dilatation. SPLEEN: Spleen is not visualized, most likely surgically absent. PANCREAS: Mild parenchymal atrophy of the pancreas. ADRENAL GLANDS: No acute abnormality. KIDNEYS, URETERS AND BLADDER: Small left interpolar  region cyst. No stones in the kidneys or ureters. No hydronephrosis. No perinephric or periureteral stranding. The urinary bladder is decompressed. GI AND BOWEL: Small hiatal hernia. Descending and sigmoid colonic diverticulosis. Multifocal inflammatory stranding along the descending colonic diverticula. Decompressed normal appendix. There is no bowel obstruction. PERITONEUM AND RETROPERITONEUM: Small volume free fluid in the left paracolic gutter and pelvis. No free air. VASCULATURE: Aorta is normal in caliber. 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 LYMPH NODES: No lymphadenopathy. REPRODUCTIVE ORGANS: Borderline prostatomegaly. BONES AND SOFT TISSUES: Osteopenia. 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 No acute osseous abnormality. No focal soft tissue abnormality. IMPRESSION:  1. Multifocal inflammatory stranding along the descending colonic diverticula, worrisome for acute, uncomplicated diverticulitis. 2. Small volume perihepatic free fluid with fluid layering in the left paracolic gutter and pelvis, likely reactive. Electronically signed by: Rogelia Myers MD 05/28/2024 09:54 AM EST RP Workstation: [id number]     Procedures   Medications Ordered in the ED  amoxicillin -clavulanate (AUGMENTIN ) 875-125 MG per tablet 1 tablet (has no administration in time range)  lactated ringers  bolus 1,000 mL (0 mLs Intravenous Stopped 05/28/24 0849)  iohexol  (OMNIPAQUE ) 300 MG/ML solution 100 mL (100 mLs Intravenous Contrast Given 05/28/24 0913)                                    Medical Decision Making Amount and/or Complexity of Data Reviewed External Data Reviewed: notes. Labs: ordered.    Details: Leukocytosis Radiology: ordered and independent interpretation performed.    Details: Diverticulitis, mild free fluid but no abscess  Risk Prescription drug management.   It is unclear what is causing what appears to be some  asymmetric swelling to his right low back.  CT is unremarkable in this regard.  However he does have what appears to be diverticulitis, which I think is causing his more recent symptoms such as diarrhea and the abdominal pain.  He does have a leukocytosis but is very comfortable here, declines anything for pain.  He feels well enough for discharge.  I think this is reasonable and while there is some free fluid that seems reactive to the diverticulitis there is no evidence of abscess, perforation, etc.  Patient feels well enough for discharge.  He appears stable for discharge home with return precautions.     Final diagnoses:  Diverticulitis    ED Discharge Orders          Ordered    amoxicillin -clavulanate (AUGMENTIN ) 875-125 MG tablet  Every 12 hours        05/28/24 1053               Freddi Hamilton, MD 05/28/24 1456  "

## 2024-05-28 NOTE — ED Triage Notes (Signed)
 Patient arrives POV from home c/c R abdominal/flank pain. Pain began 3-4 weeks ago. Feels like it begins in his mid/low flank area and wraps upwards towards his abdomen. He says he has seen many providers and had an ultrasound outpatient for this issue. Nausea/vomiting w 1 episode emesis at 2:30 this morning. Diarrhea x1 week.

## 2024-05-28 NOTE — ED Notes (Signed)
 Pt transported back from CT.

## 2024-05-28 NOTE — ED Notes (Signed)
 Person at bedside is friend and neighbor, not SO.

## 2024-05-31 ENCOUNTER — Other Ambulatory Visit: Payer: Self-pay | Admitting: Nurse Practitioner

## 2024-05-31 DIAGNOSIS — I951 Orthostatic hypotension: Secondary | ICD-10-CM

## 2024-06-03 ENCOUNTER — Telehealth: Payer: Self-pay | Admitting: Cardiology

## 2024-06-03 NOTE — Telephone Encounter (Signed)
 Medication sent in 05/31/24

## 2024-06-03 NOTE — Telephone Encounter (Signed)
 Pt's friend Bruna Africa came into office and stated that the pt is out of midodrine  (PROAMATINE ) 5 MG tablet and needs refills sent to [facility] and marked as delivery. [phone] is the best number to reach the friend, the pt does not hear well.

## 2024-06-05 ENCOUNTER — Other Ambulatory Visit (HOSPITAL_COMMUNITY): Payer: Self-pay | Admitting: Family Medicine

## 2024-06-05 DIAGNOSIS — K5792 Diverticulitis of intestine, part unspecified, without perforation or abscess without bleeding: Secondary | ICD-10-CM | POA: Insufficient documentation

## 2024-06-05 DIAGNOSIS — M545 Low back pain, unspecified: Secondary | ICD-10-CM

## 2024-06-11 ENCOUNTER — Other Ambulatory Visit: Payer: Self-pay | Admitting: Nurse Practitioner

## 2024-06-11 DIAGNOSIS — I10 Essential (primary) hypertension: Secondary | ICD-10-CM

## 2024-06-13 ENCOUNTER — Ambulatory Visit (HOSPITAL_COMMUNITY)
Admission: RE | Admit: 2024-06-13 | Discharge: 2024-06-13 | Disposition: A | Discharge status: Home / Self Care | Source: Ambulatory Visit | Attending: Family Medicine | Admitting: Family Medicine

## 2024-06-13 DIAGNOSIS — M545 Low back pain, unspecified: Secondary | ICD-10-CM | POA: Diagnosis present

## 2024-06-14 ENCOUNTER — Ambulatory Visit: Attending: Nurse Practitioner | Admitting: Nurse Practitioner

## 2024-06-14 ENCOUNTER — Encounter: Payer: Self-pay | Admitting: Nurse Practitioner

## 2024-06-14 VITALS — BP 132/80 | HR 84 | Ht 73.0 in | Wt 198.6 lb

## 2024-06-14 DIAGNOSIS — Z79899 Other long term (current) drug therapy: Secondary | ICD-10-CM

## 2024-06-14 DIAGNOSIS — Z87898 Personal history of other specified conditions: Secondary | ICD-10-CM | POA: Diagnosis not present

## 2024-06-14 DIAGNOSIS — Z8673 Personal history of transient ischemic attack (TIA), and cerebral infarction without residual deficits: Secondary | ICD-10-CM | POA: Diagnosis not present

## 2024-06-14 DIAGNOSIS — E875 Hyperkalemia: Secondary | ICD-10-CM | POA: Diagnosis not present

## 2024-06-14 DIAGNOSIS — I10 Essential (primary) hypertension: Secondary | ICD-10-CM

## 2024-06-14 DIAGNOSIS — I951 Orthostatic hypotension: Secondary | ICD-10-CM | POA: Diagnosis not present

## 2024-06-14 DIAGNOSIS — E785 Hyperlipidemia, unspecified: Secondary | ICD-10-CM | POA: Diagnosis not present

## 2024-06-14 NOTE — Progress Notes (Unsigned)
 " Cardiology Office Note:  .   Date: 06/14/2024 ID:  Andre [name], DOB 1945-09-28, MRN [id number] PCP: Joan Laneta HERO, FNP (Inactive)  [facility] Providers Cardiologist:  [name], MD    History of Present Illness: .   Andre [name] is a 79 y.o. male with a PMH of chest pain, hypertension, hyperlipidemia, type 2 diabetes, former smoker, past history of TIAs, COPD, OSA, GERD, who presents today for scheduled follow-up.  Last seen by Dr. Carrier Alvan on December 27, 2021.  Patient noted some chest pain with symptoms that presented to be mixed.  Due to multiple CAD risk factors, a exercise nuclear stress test was arranged to further evaluate.  Stress test was WNL.  07/10/2023 - Today he presents for 1 year follow-up with his friend.  He admits to chronic chest pain across his entire chest, seems to be triggered with high exertional activities, friend says he has to rest for symptoms to be relieved usually within 15-20 minutes. 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  Says symptoms seem to be worse more recently. He shares with me that he has in-laws staying with him that is causing him some stress, says symptoms seem to be worse during periods of emotional upset. Denies any palpitations, syncope, presyncope, dizziness, orthopnea, PND, swelling or significant weight changes, acute bleeding, or claudication.  08/21/2023 -presents today for follow-up with his friend.  Patient is a difficult historian due to being hard of hearing.  Admits to generalized pains.  Episodes of chest pain have reduced since last office visit.  He believes his symptoms have improved some.  Friend says that his breathing has improved as he does not report being short of breath as often. Denies any recent chest pain, shortness of breath, palpitations, syncope, presyncope, dizziness, orthopnea, PND, swelling or significant weight changes, acute bleeding, or  claudication. Has been told by PCP that he has COPD per friend's report.   11/17/2023 -he presents today for follow-up with his wife.  He continues to admit to some episodes of chest pain, has had to take some nitroglycerin  for relief.  Wife is wanting to know about long-acting nitroglycerin 's-states she is on Imdur  for this. Denies any  palpitations, syncope, presyncope, dizziness, orthopnea, PND, swelling or significant weight changes, acute bleeding, or claudication.  Admits to shortness of breath with exertion that is stable since previous office visit.  Wife and patient report that he is currently participating in pulmonary rehab, classes called [facility] as well as doing cardiac rehab.  12/13/2023 - Here for follow-up with his wife.  Says he did have 1 episode of chest pain while loading groceries into his car-says it was hot that day.  Took a nitroglycerin  that did not seem to subside his pain, then took a second tablet and said this helped.  Has not had any more issues since.  Could not tolerate Imdur  as this appears his dropped his BP too low.  He confirms to me he is only taking midodrine  once a day and continues to take losartan  -half a tablet daily.  He says that orthostatics were obtained at a previous doctor's office visit where his BP dropped 30 mmHg.  Currently denies any orthostatic symptoms. Denies any   palpitations, syncope, presyncope, dizziness, orthopnea, PND, swelling or significant weight changes, acute bleeding, or claudication. Breathing is stable.   02/02/2024- Here for follow-up with his significant other. Tells me he is overall  doing better since I have last seen him. He tells me he has had 3 episodes of CP since last visit, but episodes never got severe. Tells me he is breathing well, feels pretty good. Enjoying doing pulmonary rehab at [facility] Denies any shortness of breath, palpitations, syncope, presyncope, dizziness, orthopnea, PND, swelling or significant weight changes,  acute bleeding, or claudication.  05/07/2024 - Here for follow-up. Has noticed fewer episodes of chest pain since last office visit, but still noticing episodes. Denies any shortness of breath, palpitations, syncope, presyncope, dizziness, orthopnea, PND, swelling or significant weight changes, acute bleeding, or claudication. Tells me his previous cath was in the early 90's, no stent performed and confirms with me he has no history of stents in the past.   06/14/2024 - Here for follow-up.  Denies any recurrent chest pain since last office visit.  He is without acute cardiac complaints today. Denies any chest pain, shortness of breath, palpitations, syncope, presyncope, dizziness, orthopnea, PND, swelling or significant weight changes, acute bleeding, or claudication.  ROS: Negative.  See HPI. 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  Studies Reviewed: SABRA    EKG: EKG is not ordered today.      Echo 07/2023: 1. Left ventricular ejection fraction, by estimation, is 55 to 60%. The  left ventricle has normal function. The left ventricle has no regional  wall motion abnormalities. There is mild left ventricular hypertrophy.  Left ventricular diastolic parameters  are consistent with Grade I diastolic dysfunction (impaired relaxation).  The global longitudinal strain is indeterminate.   2. Right ventricular systolic function is normal. The right ventricular  size is normal. Tricuspid regurgitation signal is inadequate for assessing  PA pressure.   3. The mitral valve is normal in structure. No evidence of mitral valve  regurgitation. No evidence of mitral stenosis.   4. The aortic valve is tricuspid. Aortic valve regurgitation is mild. No  aortic stenosis is present.   Comparison(s): No prior Echocardiogram.  Lexiscan  07/2023:   Findings are consistent with no ischemia. The study is low risk.   No ST deviation was noted. The ECG was negative for ischemia.   LV perfusion is normal.  No  significant myocardial perfusion defects to indicate scar or ischemia in the setting of diaphragmatic attenuation.   Left ventricular function is normal. Nuclear stress EF: 56%.  Lexiscan  12/2021:    Lexiscan  stress is electrically negative for ischemia   Myoview  scan shows normal perfusion   No ischemia or scar   LVEF calculated at 70%   Overall low risk study  Physical Exam:   VS:  BP 132/80   Pulse 84   Ht 6' 1 (1.854 m)   Wt 198 lb 9.6 oz (90.1 kg)   SpO2 92%   BMI 26.20 kg/m    Wt Readings from Last 3 Encounters:  06/14/24 198 lb 9.6 oz (90.1 kg)  05/28/24 199 lb (90.3 kg)  05/07/24 199 lb 9.6 oz (90.5 kg)    GEN: Well nourished, well developed in no acute distress NECK: No JVD; No carotid bruits CARDIAC: S1/S2, RRR, no murmurs, rubs, gallops RESPIRATORY:  Clear and diminished to auscultation without rales, wheezing or rhonchi  ABDOMEN: Soft, non-tender, non-distended EXTREMITIES:  No edema; No deformity   ASSESSMENT AND PLAN: .    Hx of chest pain Denies any recurrent symptoms since last office visit.  GDMT limited due to history of orthostatic hypotension-see below.  Reviewed most recent NST that was low risk and reassuring.  Continue current medication regimen. Heart healthy diet and regular cardiovascular exercise encouraged. Care and ED precautions discussed.    2. HTN, orthostatic hypotension Blood pressure stable. Discussed to monitor BP at home at least 2 hours after medications and sitting for 5-10 minutes. Continue current medication regimen. Heart healthy diet and regular cardiovascular exercise encouraged.   3. HLD Most recent LDL 78.  Continue atorvastatin .  This is being managed by his PCP. Heart healthy diet and regular cardiovascular exercise encouraged.   4. Hx of TIA's Denies any recent symptoms.  Continue current medication regimen. Continue to follow with PCP.  5. Hyperkalemia, medication management  Most recent labs from 3 weeks ago showed  potassium level 5.3.  Will recheck BMET. Continue to follow with PCP.   Dispo: Follow-up with MD/APP in 6 months or sooner if any changes.  Signed, Almarie Crate, NP   "

## 2024-06-14 NOTE — Patient Instructions (Signed)
 Medication Instructions:   Continue all current medications.   Labwork:  BMET - order given today Please do in 1-2 weeks  Office will contact with results via phone, letter or mychart.    Testing/Procedures:  none  Follow-Up:  6 months   Any Other Special Instructions Will Be Listed Below (If Applicable).   If you need a refill on your cardiac medications before your next appointment, please call your pharmacy.

## 2024-06-21 ENCOUNTER — Ambulatory Visit: Payer: Self-pay | Admitting: Nurse Practitioner

## 2024-06-24 NOTE — Telephone Encounter (Signed)
-----   Message from Almarie Crate, NP sent at 06/21/2024  3:52 PM EST ----- Labs look good.  Almarie Crate, AGNP-C

## 2024-06-24 NOTE — Telephone Encounter (Signed)
 The patient has been notified of the result and verbalized understanding.  All questions (if any) were answered. Littie CHRISTELLA Croak, CMA 06/24/2024 1:37 PM

## 2024-06-25 ENCOUNTER — Emergency Department (HOSPITAL_COMMUNITY)

## 2024-06-25 ENCOUNTER — Ambulatory Visit: Admitting: Orthopedic Surgery

## 2024-06-25 ENCOUNTER — Emergency Department (HOSPITAL_COMMUNITY)
Admission: EM | Admit: 2024-06-25 | Discharge: 2024-06-25 | Disposition: A | Discharge status: Home / Self Care | Source: Other Acute Inpatient Hospital | Attending: Emergency Medicine | Admitting: Emergency Medicine

## 2024-06-25 ENCOUNTER — Other Ambulatory Visit: Payer: Self-pay

## 2024-06-25 DIAGNOSIS — W1830XA Fall on same level, unspecified, initial encounter: Secondary | ICD-10-CM | POA: Insufficient documentation

## 2024-06-25 DIAGNOSIS — Z7982 Long term (current) use of aspirin: Secondary | ICD-10-CM | POA: Insufficient documentation

## 2024-06-25 DIAGNOSIS — R1031 Right lower quadrant pain: Secondary | ICD-10-CM | POA: Insufficient documentation

## 2024-06-25 DIAGNOSIS — R55 Syncope and collapse: Secondary | ICD-10-CM | POA: Insufficient documentation

## 2024-06-25 DIAGNOSIS — R519 Headache, unspecified: Secondary | ICD-10-CM | POA: Insufficient documentation

## 2024-06-25 LAB — COMPREHENSIVE METABOLIC PANEL WITH GFR
ALT: 23 U/L (ref 0–44)
AST: 19 U/L (ref 15–41)
Albumin: 4.2 g/dL (ref 3.5–5.0)
Alkaline Phosphatase: 62 U/L (ref 38–126)
Anion gap: 13 (ref 5–15)
BUN: 24 mg/dL — ABNORMAL HIGH (ref 8–23)
CO2: 24 mmol/L (ref 22–32)
Calcium: 8.6 mg/dL — ABNORMAL LOW (ref 8.9–10.3)
Chloride: 104 mmol/L (ref 98–111)
Creatinine, Ser: 1.02 mg/dL (ref 0.61–1.24)
GFR, Estimated: >60 mL/min
Glucose, Bld: 124 mg/dL — ABNORMAL HIGH (ref 70–99)
Potassium: 4.4 mmol/L (ref 3.5–5.1)
Sodium: 140 mmol/L (ref 135–145)
Total Bilirubin: 0.3 mg/dL (ref 0.0–1.2)
Total Protein: 7 g/dL (ref 6.5–8.1)

## 2024-06-25 LAB — CBC WITH DIFFERENTIAL/PLATELET
Abs Immature Granulocytes: 0.02 K/uL (ref 0.00–0.07)
Basophils Absolute: 0.1 K/uL (ref 0.0–0.1)
Basophils Relative: 1 %
Eosinophils Absolute: 0.3 K/uL (ref 0.0–0.5)
Eosinophils Relative: 3 %
HCT: 41.9 % (ref 39.0–52.0)
Hemoglobin: 13.8 g/dL (ref 13.0–17.0)
Immature Granulocytes: 0 %
Lymphocytes Relative: 28 %
Lymphs Abs: 3.1 K/uL (ref 0.7–4.0)
MCH: 32.5 pg (ref 26.0–34.0)
MCHC: 32.9 g/dL (ref 30.0–36.0)
MCV: 98.6 fL (ref 80.0–100.0)
Monocytes Absolute: 1 K/uL (ref 0.1–1.0)
Monocytes Relative: 9 %
Neutro Abs: 6.8 K/uL (ref 1.7–7.7)
Neutrophils Relative %: 59 %
Platelets: 340 K/uL (ref 150–400)
RBC: 4.25 MIL/uL (ref 4.22–5.81)
RDW: 14.5 % (ref 11.5–15.5)
WBC: 11.3 K/uL — ABNORMAL HIGH (ref 4.0–10.5)
nRBC: 0 % (ref 0.0–0.2)

## 2024-06-25 LAB — URINALYSIS, ROUTINE W REFLEX MICROSCOPIC
Bacteria, UA: NONE SEEN
Bilirubin Urine: NEGATIVE
Glucose, UA: NEGATIVE mg/dL
Hgb urine dipstick: NEGATIVE
Ketones, ur: NEGATIVE mg/dL
Nitrite: NEGATIVE
Protein, ur: 30 mg/dL — AB
Specific Gravity, Urine: 1.031 — ABNORMAL HIGH (ref 1.005–1.030)
pH: 5 (ref 5.0–8.0)

## 2024-06-25 LAB — LIPASE, BLOOD: Lipase: 18 U/L (ref 11–51)

## 2024-06-25 LAB — TROPONIN T, HIGH SENSITIVITY
Troponin T High Sensitivity: 12 ng/L (ref 0–19)
Troponin T High Sensitivity: 11 ng/L (ref 0–19)

## 2024-06-25 MED ORDER — IOHEXOL 350 MG/ML SOLN
100.0000 mL | Freq: Once | INTRAVENOUS | Status: AC | PRN
  Administered 2024-06-25: 100 mL via INTRAVENOUS

## 2024-06-25 MED ORDER — LACTATED RINGERS IV BOLUS
1000.0000 mL | Freq: Once | INTRAVENOUS | Status: AC
  Administered 2024-06-25: 1000 mL via INTRAVENOUS

## 2024-06-25 NOTE — Progress Notes (Signed)
 Called a rapid response on patient around 0910 due to patient falling off exercise equipment. Pt did not hit his head. Head was lowered to the ground. Pt was hypotensive when first arriving at 88/50, staff made pt drink water  and BP came up to 104/60. Vitals WNL post fall and blood glucose was normal at 120. Pt could not tell us  where he was or his name after falling. Pt transported to the ER via rapid response staff. Accompanied by friend, Bruna Africa.

## 2024-06-25 NOTE — Discharge Instructions (Signed)
 Your blood pressure was low today, causing you to pass out.  Call your doctor for an urgent outpatient appointment.  Make sure you are eating and drinking appropriately.  If you develop recurrent dizziness or lightheadedness, chest pain, trouble breathing, or any other new/concerning symptoms then return to the ER.

## 2024-06-25 NOTE — ED Provider Notes (Signed)
 " Andre [name]   CSN: [id number] Arrival date & time: 06/25/24  9080     Patient presents with: Near Syncope, Fall, and Chest Pain   Andre [name] is a 79 y.o. male.   HPI 79 year old male presents with syncope at cardiac rehab.  Patient has been dealing with back pain and side/abdominal pain for a while.  Recently had an MRI that showed some bulging disks and is due to see orthopedics as an outpatient.  Today, when he arrived at cardiac rehab as per normal he felt lightheaded.  They checked his blood pressure and it was in the 80s.  He drank some water  and it seemed to come up so he went to the machines.  He was sitting on a machine and apparently passed out and fell or slid to the ground.  Reportedly he had no head injury but he states he has had a headache on the back of his head since waking up.  He denies neck pain.  He has been dealing with intermittent chest pain over the last few days, none currently. He has had some diarrhea over the past few days. His initial BP in the ER is 98 systolic.  He denies any urinary or bowel incontinence.  He states the abdominal/flank pain that he had seemed to get better with the antibiotics but has come back.  The back pain has remained constant.  Prior to Admission medications  Medication Sig Start Date End Date Taking? Authorizing Provider  aspirin  325 MG tablet Take 325 mg by mouth daily.    [provider]  atorvastatin  (LIPITOR) 40 MG tablet Take 40 mg by mouth daily. 12/17/21   [provider]  celecoxib (CELEBREX) 200 MG capsule Take 200 mg by mouth daily. 12/16/21   [provider]  diazepam (VALIUM) 5 MG tablet Take 5 mg by mouth as needed. 04/21/22   [provider]  DULoxetine  (CYMBALTA ) 30 MG capsule Take 1 capsule (30 mg total) by mouth daily. 08/08/22   Jaffe, Adam R, DO  JANUVIA 100 MG tablet Take 100 mg by mouth daily. 08/30/19   [provider]  levothyroxine (SYNTHROID) 50 MCG tablet Take 50 mcg by mouth daily. 04/11/24   [provider]  metFORMIN (GLUCOPHAGE) 500 MG tablet Take 500 mg by mouth 2 (two) times daily. Patient taking differently: Take 500 mg by mouth 3 (three) times daily. 09/16/19   [provider]  midodrine  (PROAMATINE ) 5 MG tablet take 1 tablet twice daily with a meal 05/31/24   Alvan Dorn FALCON, MD  nitroGLYCERIN  (NITROSTAT ) 0.4 MG SL tablet Place 1 tablet (0.4 mg total) under the tongue every 5 (five) minutes as needed for chest pain. 07/10/23 11/16/24  Miriam Norris, NP  omeprazole  (PRILOSEC) 20 MG capsule TAKE 1 CAPSULE BY MOUTH ONCE A DAY. 08/08/13   Bari Theodoro FALCON, MD  ranolazine  (RANEXA ) 500 MG 12 hr tablet Take 1 tablet (500 mg total) by mouth 2 (two) times daily. 05/07/24   Miriam Norris, NP  tiZANidine  (ZANAFLEX ) 2 MG tablet take 1 tablet by mouth at bedtime. 11/22/22   Skeet Juliene SAUNDERS, DO  traMADol  (ULTRAM ) 50 MG tablet Take 1 tablet (50 mg total) by mouth every 6 (six) hours as needed. 05/27/20   Zammit, Joseph, MD    Allergies: Patient has no known allergies.    Review of Systems  Respiratory:  Negative for shortness of breath.   Cardiovascular:  Positive  for chest pain.  Gastrointestinal:  Positive for abdominal pain and diarrhea.  Musculoskeletal:  Positive for back pain.  Neurological:  Positive for syncope, light-headedness and headaches.    Updated Vital Signs BP 138/72   Pulse (!) 59   Temp 97.8 F (36.6 C) (Oral)   Resp (!) 21   Ht 6' 1 (1.854 m)   Wt 90.1 kg   SpO2 93%   BMI 26.20 kg/m   Physical Exam Vitals and nursing [name] reviewed.  Constitutional:      General: He is not in acute distress.    Appearance: He is well-developed. He is not ill-appearing or diaphoretic.  HENT:     Head: Normocephalic.   Cardiovascular:     Rate and Rhythm: Normal rate and regular rhythm.     Heart sounds: Normal heart sounds.  Pulmonary:     Effort:  Pulmonary effort is normal.     Breath sounds: Normal breath sounds.  Abdominal:     Palpations: Abdomen is soft.     Tenderness: There is abdominal tenderness (right sided) in the right lower quadrant.  Skin:    General: Skin is warm and dry.  Neurological:     Mental Status: He is alert.     Comments: Weakly moves all 4 extremities.     (all labs ordered are listed, but only abnormal results are displayed) Labs Reviewed  CBC WITH DIFFERENTIAL/PLATELET - Abnormal; Notable for the following components:      Result Value   WBC 11.3 (*)    All other components within normal limits  COMPREHENSIVE METABOLIC PANEL WITH GFR - Abnormal; Notable for the following components:   Glucose, Bld 124 (*)    BUN 24 (*)    Calcium  8.6 (*)    All other components within normal limits  URINALYSIS, ROUTINE W REFLEX MICROSCOPIC - Abnormal; Notable for the following components:   Color, Urine AMBER (*)    APPearance HAZY (*)    Specific Gravity, Urine 1.031 (*)    Protein, ur 30 (*)    Leukocytes,Ua SMALL (*)    All other components within normal limits  LIPASE, BLOOD  I-STAT CHEM 8, ED  TROPONIN T, HIGH SENSITIVITY  TROPONIN T, HIGH SENSITIVITY    EKG: EKG Interpretation Date/Time:  Tuesday June 25 2024 09:29:31 EST Ventricular Rate:  75 PR Interval:  178 QRS Duration:  86 QT Interval:  400 QTC Calculation: 410 R Axis:   36  Text Interpretation: Sinus rhythm Atrial premature complexes Borderline low voltage, extremity leads Abnormal R-wave progression, early transition similar to May 07 2024 Confirmed by Freddi Hamilton [phone]) on 06/25/2024 9:59:27 AM  Radiology: CT Angio Chest/Abd/Pel for Dissection W and/or Wo Contrast Result Date: 06/25/2024 CLINICAL DATA:  Acute aortic syndrome suspected. Back and chest pain with near syncopal episode. EXAM: CT ANGIOGRAPHY CHEST, ABDOMEN AND PELVIS TECHNIQUE: Non-contrast CT of the chest was initially obtained. Multidetector CT imaging through the  chest, abdomen and pelvis was performed using the standard protocol during bolus administration of intravenous contrast. Multiplanar reconstructed images and MIPs were obtained and reviewed to evaluate the vascular anatomy. RADIATION DOSE REDUCTION: This exam was performed according to the departmental dose-optimization program which includes automated exposure control, adjustment of the mA and/or kV according to patient size and/or use of iterative reconstruction technique. CONTRAST:  OMNIPAQUE  IOHEXOL  350 MG/ML SOLN COMPARISON:  CT abdomen/pelvis 05/28/2024, 09/28/2019 and CT chest 09/01/2023, 01/05/2022 FINDINGS: CTA CHEST FINDINGS Cardiovascular: Mild cardiomegaly. Calcified plaque over the left  anterior descending and right coronary arteries. Thoracic aorta is normal in caliber. No evidence of aortic aneurysm or dissection. Calcified plaque throughout the thoracic aorta. Pulmonary arterial system is well opacified without evidence of emboli. Remaining vascular structures are unremarkable. Mediastinum/Nodes: No mediastinal or hilar adenopathy. Few small subcentimeter lymph nodes adjacent the distal esophagus. Lungs/Pleura: Lungs are adequately inflated. Biapical pleural thickening. Minimal bibasilar dependent atelectasis is present. Stable 6 mm nodule over the left apex as this is unchanged from August 2023 and therefore considered benign. No effusion. Airways are unremarkable. Musculoskeletal: No focal abnormality. Review of the MIP images confirms the above findings. CTA ABDOMEN AND PELVIS FINDINGS VASCULAR Aorta: Mild calcified plaque throughout the abdominal aorta without evidence of aneurysm or dissection. Celiac: Calcified plaque at its origin as the celiac artery is otherwise patent. SMA: Calcified plaque at its origin as the superior mesenteric artery and branches are otherwise patent. Renals: Calcified plaque at their origins as the renal arteries are otherwise patent. IMA: Origin from the  abdominal aorta not visualized. IMA appears to be supplied from an SMA branch. Inflow: Calcified plaque throughout the iliac arteries without significant focal stenosis or occlusion. Veins: No obvious venous abnormality within the limitations of this arterial phase study. Review of the MIP images confirms the above findings. NON-VASCULAR Hepatobiliary: Prior cholecystectomy. Liver and biliary tree otherwise unremarkable. Pancreas: No focal abnormality. Spleen: Absent. Adrenals/Urinary Tract: Adrenal glands are normal. Kidneys are normal in size without hydronephrosis or nephrolithiasis. Left renal cyst unchanged. Ureters and bladder are normal. Stomach/Bowel: Stomach and small bowel are normal. Appendix is normal. Significant diverticulosis throughout the colon without evidence of acute inflammation. Lymphatic: Stable 1.5 cm portacaval lymph node. Small subcentimeter periaortic lymph nodes unchanged. Otherwise, no significant adenopathy. Reproductive: Prostate is unremarkable. Other: No free fluid or focal inflammatory change. Musculoskeletal: Degenerative changes with multilevel disc disease over the lumbar spine. No focal abnormality. Review of the MIP images confirms the above findings. IMPRESSION: 1. No acute findings in the chest, abdomen or pelvis. No evidence of aortic aneurysm or dissection. 2. Aortic atherosclerosis. Atherosclerotic coronary artery disease. 3. Stable 6 mm nodule over the left apex unchanged for greater than 2 years and therefore benign. 4. Colonic diverticulosis without evidence of acute inflammation. 5. Stable 1.5 cm portacaval lymph node. Aortic Atherosclerosis (ICD10-I70.0). Electronically Signed   By: Toribio Agreste M.D.   On: 06/25/2024 13:08   CT Head Wo Contrast Result Date: 06/25/2024 EXAM: CT HEAD WITHOUT CONTRAST 06/25/2024 11:59:29 AM TECHNIQUE: CT of the head was performed without the administration of intravenous contrast. Automated exposure control, iterative reconstruction,  and/or weight based adjustment of the mA/kV was utilized to reduce the radiation dose to as low as reasonably achievable. COMPARISON: Brain MRI 01/26/2009, CT head 08/09/2021. CLINICAL HISTORY: 79 year old male. Head trauma, minor (Age >= 65y). Fall at rehab. FINDINGS: BRAIN AND VENTRICLES: No acute hemorrhage. No evidence of acute infarct. 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 And similar chronic lacunar infarct of the right caudate is stable since 2023. Background brain volume within normal limits, and other gray white differentiation stable and within normal limits. No hydrocephalus. No extra-axial collection. No mass effect or midline shift. No suspicious intracranial vascular hyperdensity. Spine atherosclerosis at the skull base. ORBITS: No acute abnormality. SINUSES: Paranasal sinuses, tympanic cavities and mastoids remain well aerated. SOFT TISSUES AND SKULL: No acute soft tissue abnormality. No skull fracture. IMPRESSION: 1. No acute traumatic injury identified. 2. 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 Electronically signed by: Helayne Hurst MD 06/25/2024 12:31 PM EST RP Workstation: [id number]   CT Cervical Spine Wo Contrast Result Date: 06/25/2024 CLINICAL DATA:  Neck trauma.  Patient fell during cardiac rehab. EXAM: CT CERVICAL SPINE WITHOUT CONTRAST TECHNIQUE: Multidetector CT imaging of the cervical spine was performed without intravenous contrast. Multiplanar CT image reconstructions were also generated. RADIATION DOSE REDUCTION: This exam was performed according to the departmental dose-optimization program which includes automated exposure control, adjustment of the mA and/or kV according to patient size and/or use of iterative reconstruction technique. COMPARISON:  Cervical spine radiographs 11/25/2019. MRI cervical spine 10/06/2020. Chest CT 09/01/2023. FINDINGS: Technical [name]: Despite efforts by the  technologist and patient, mild motion artifact is present on today's exam and could not be eliminated. This reduces exam sensitivity and specificity. Alignment: Normal. Skull base and vertebrae: No evidence of acute cervical spine fracture or traumatic subluxation. Soft tissues and spinal canal: No prevertebral fluid or swelling. No visible canal hematoma. Disc levels: Multilevel spondylosis with disc space narrowing, endplate osteophytes and facet hypertrophy, similar to previous MRI. Mild spinal stenosis at multiple levels. Multilevel foraminal narrowing, worst on the left at C3-4 and C4-5. Upper chest: Mild scarring and pleural thickening at both lung apices, similar to previous chest CT. Additional chest findings dictated separately. Other: Bilateral carotid atherosclerosis. Small cervical lymph nodes are not pathologically enlarged. IMPRESSION: 1. No evidence of acute cervical spine fracture, traumatic subluxation or static signs of instability. 2. Multilevel cervical spondylosis as described. Electronically Signed   By: Elsie Perone M.D.   On: 06/25/2024 12:19   DG Chest Portable 1 View Result Date: 06/25/2024 EXAM: 1 VIEW(S) XRAY OF THE CHEST 06/25/2024 10:08:00 AM COMPARISON: 05/12/2022. CLINICAL HISTORY: syncope, chest pain FINDINGS: LUNGS AND PLEURA: Chronic coarsened markings. Lower lung volumes. No pulmonary edema. No pleural effusion. No pneumothorax. HEART AND MEDIASTINUM: Aortic calcification. No acute abnormality of the cardiac and mediastinal silhouettes. BONES AND SOFT TISSUES: No acute osseous abnormality. IMPRESSION: 1. No acute findings. 2. Lower lung volumes. Electronically signed by: Dayne Hassell MD 06/25/2024 10:31 AM EST RP Workstation: [id number]     Procedures   Medications Ordered in the ED  lactated ringers  bolus 1,000 mL (0 mLs Intravenous Stopped 06/25/24 1240)  iohexol  (OMNIPAQUE ) 350 MG/ML injection 100 mL (100 mLs Intravenous Contrast Given 06/25/24 1125)                                     Medical Decision Making Amount and/or Complexity of Data Reviewed Labs: ordered.    Details: Normal troponins x 2 Radiology: ordered and independent interpretation performed.    Details: No head bleed ECG/medicine tests: ordered and independent interpretation performed.    Details: No ischemia  Risk Prescription drug management.   Patient presents with syncope at cardiac rehab.  Sounds like his blood pressure has been low all morning.  He actually later tells me that he woke up at 3 AM and was feeling weak and lightheaded.  I doubt this was a significant arrhythmia causing his symptoms but rather the low blood pressure.  He has had poor p.o. intake recently.  His blood pressure has come up significantly with fluids.  Otherwise he seems to have a mild headache after the syncope but I think this is likely from either minor injury or the low blood pressure/hypovolemia.  His headache is now gone.  Head CT  is benign.  Dissection study ordered given his chest pain and syncope though the chest pain sounds very atypical and mild.  Chest pain has resolved.  Troponins are negative, no ischemia on his ECG.  CTA is unremarkable.  He seems to have chronic back pain with no new or concerning CNS findings.  Will have him follow-up with PCP but at this point he appears stable for discharge and is requesting discharge.  Given return precautions.     Final diagnoses:  Syncope and collapse    ED Discharge Orders     None          Freddi Hamilton, MD 06/25/24 1506  "

## 2024-06-25 NOTE — ED Notes (Signed)
 Pt walked to restroom in hallway, pt refused to use restroom in room and wanted to walk with cane, said he felt fine.

## 2024-06-25 NOTE — ED Triage Notes (Signed)
 Patient was being seen at cardiac rehab when he began to fall over, was assisted to the grown by staff. Witness, patient did NOT hit his head. Had reported back pain and chest pain. Chest pain described as pin and needles across chest.

## 2024-06-26 LAB — CBG MONITORING, ED: Glucose-Capillary: 120 mg/dL — ABNORMAL HIGH (ref 70–99)

## 2024-06-28 ENCOUNTER — Telehealth: Payer: Self-pay | Admitting: *Deleted

## 2024-06-28 ENCOUNTER — Encounter: Payer: Self-pay | Admitting: Gastroenterology

## 2024-06-28 ENCOUNTER — Ambulatory Visit: Admitting: Gastroenterology

## 2024-06-28 VITALS — BP 112/66 | HR 75 | Temp 97.6°F | Ht 73.0 in | Wt 199.8 lb

## 2024-06-28 DIAGNOSIS — Z860101 Personal history of adenomatous and serrated colon polyps: Secondary | ICD-10-CM | POA: Diagnosis not present

## 2024-06-28 DIAGNOSIS — R933 Abnormal findings on diagnostic imaging of other parts of digestive tract: Secondary | ICD-10-CM | POA: Insufficient documentation

## 2024-06-28 DIAGNOSIS — K760 Fatty (change of) liver, not elsewhere classified: Secondary | ICD-10-CM | POA: Diagnosis not present

## 2024-06-28 DIAGNOSIS — R10A1 Flank pain, right side: Secondary | ICD-10-CM

## 2024-06-28 DIAGNOSIS — K5732 Diverticulitis of large intestine without perforation or abscess without bleeding: Secondary | ICD-10-CM

## 2024-06-28 NOTE — Patient Instructions (Signed)
 Please follow up with Dr. Margrette regarding your back pain.  Please notify me if you have recurrent abdominal pain, bloating, or any constipation/diarrhea issues.   We are going to schedule you for a colonoscopy to follow up on the abnormal colon seen on CT scan last month.   Regarding fatty liver: At this time I would not advise medication for fatty liver based on your fibrosis risk scores being low. You should try to eat healthy, low fat/low sugar diet.  You should try to avoid alcohol as this can make your fatty liver worse.  We will follow your labs in six months, we will send you a reminder to complete labs around July 2026.

## 2024-06-28 NOTE — Progress Notes (Signed)
 "    GI Office Note    Referring Provider: Stroud, Natalie M, FNP Primary Care Physician:  [name], MD  Primary Gastroenterologist: Ozell Hollingshead, MD   Chief Complaint   Chief Complaint  Patient presents with   fatty liver    Referred here for fatty liver, also states that he was being seen about diverticulitis and lower right side pain.     History of Present Illness   Andre [name] is a 79 y.o. male presenting today at the request of Laneta Calk, FNP for fatty liver, GERD. Patient wants to follow up regarding diverticulitis treated for in 05/2024 by ED.  Discussed the use of AI scribe software for clinical note transcription with the patient, who gave verbal consent to proceed.  History of Present Illness Andre [name] is a 79 year old male with diverticulosis and fatty liver disease who presents for evaluation following an ER visit for abdominal and back pain.  He was evaluated in the ER for severe right-sided back pain with visible swelling and upper mid to left abdominal pain associated abdominal pain and bloating. He completed a 14-day course of Augmentin  for presumed diverticulitis and now denies ongoing abdominal pain. He has not had a colonoscopy since 2018.  Persistent right-sided back pain has been present for several months with intermittent visible swelling on the right side. Pain radiates from right mid back around to right side/flank. Worsens with movement, limits walking and standing, and disrupts sleep. Not affected by meals. MRI reportedly showed multiple abnormal discs including a bulging disc, and he is scheduled for further back evaluation with orthopedist, Dr. Margrette.   He was noted to have fatty liver on ultrasound done couple months ago to evaluate right flank pain. Recent CTs with unremarkable liver. Family history includes cirrhosis in his mother and cousin, both were alcoholics. He acknowledges prior alcohol use but does not clarify current  intake, reported two drinks daily previously. Significant hearing impairment requires assistance from his neighbor for communication.  Bowel movements are daily without blood. He has a great appetite but states his live in sister does not cook and he is not able to cook. He consumes canned foods frequently. No heartburn/dysphagia. tolerates simple foods and reports occasional nausea without vomiting. He recently had a syncopal episode while in cardiac rehab and completed full evaluation in the ED with CTA chest/abd/pelvis/cervical spine/head. Suspected hypotensive related.       Prior Data   Results   06/25/2024: Sodium 140, potassium 4.4, creatinine 1.02 Albumin 4.2, total protein 7, AST 19, ALT 23, alk phos 62, total bilirubin 0.3 White blood cell count 11.3, hemoglobin 13.8, platelets 340  CTA chest/abdomen/pelvis June 25, 2024: IMPRESSION: 1. No acute findings in the chest, abdomen or pelvis. No evidence of aortic aneurysm or dissection. 2. Aortic atherosclerosis. Atherosclerotic coronary artery disease. 3. Stable 6 mm nodule over the left apex unchanged for greater than 2 years and therefore benign. 4. Colonic diverticulosis without evidence of acute inflammation. 5. Stable 1.5 cm portacaval lymph node.  CT abdomen pelvis with contrast May 28, 2024: IMPRESSION: 1. Multifocal inflammatory stranding along the descending colonic diverticula, worrisome for acute, uncomplicated diverticulitis. 2. Small volume perihepatic free fluid with fluid layering in the left paracolic gutter and pelvis, likely reactive.  Abdominal ultrasound May 09, 2024: IMPRESSION: Diffuse increased echogenicity of the hepatic parenchyma is a nonspecific indicator of hepatocellular dysfunction, most commonly steatosis.   Colonoscopy 12/26/11: -Colon polyps removed, tubular adenomas - Pancolonic diverticulosis -  Ileal erosions of uncertain significance - External hemorrhoids and anal  papula -Next colonoscopy in 5 years   Medications   Current Outpatient Medications  Medication Sig Dispense Refill   aspirin  325 MG tablet Take 325 mg by mouth daily.     atorvastatin  (LIPITOR) 40 MG tablet Take 40 mg by mouth daily.     celecoxib (CELEBREX) 200 MG capsule Take 200 mg by mouth daily.     diazepam (VALIUM) 5 MG tablet Take 5 mg by mouth as needed.     DULoxetine  (CYMBALTA ) 30 MG capsule Take 1 capsule (30 mg total) by mouth daily. 30 capsule 5   JANUVIA 100 MG tablet Take 100 mg by mouth daily.     levothyroxine (SYNTHROID) 50 MCG tablet Take 50 mcg by mouth daily.     metFORMIN (GLUCOPHAGE) 500 MG tablet Take 500 mg by mouth 2 (two) times daily. (Patient taking differently: Take 500 mg by mouth 3 (three) times daily.)     midodrine  (PROAMATINE ) 5 MG tablet take 1 tablet twice daily with a meal 180 tablet 3   nitroGLYCERIN  (NITROSTAT ) 0.4 MG SL tablet Place 1 tablet (0.4 mg total) under the tongue every 5 (five) minutes as needed for chest pain. 25 tablet 3   omeprazole  (PRILOSEC) 20 MG capsule TAKE 1 CAPSULE BY MOUTH ONCE A DAY. 30 capsule 0   ranolazine  (RANEXA ) 500 MG 12 hr tablet Take 1 tablet (500 mg total) by mouth 2 (two) times daily. 60 tablet 5   tiZANidine  (ZANAFLEX ) 2 MG tablet take 1 tablet by mouth at bedtime. 30 tablet 2   traMADol  (ULTRAM ) 50 MG tablet Take 1 tablet (50 mg total) by mouth every 6 (six) hours as needed. 20 tablet 0   No current facility-administered medications for this visit.    Allergies   Allergies as of 06/28/2024   (No Known Allergies)    Past Medical History   Past Medical History:  Diagnosis Date   Adenomatous polyp 2002   tcs by Dr. Rosalie   Diabetes mellitus without complication [facility])    Diverticula of colon 2002   L side   GERD (gastroesophageal reflux disease)    Hemorrhoids 2002   internal and external   Hiatal hernia 2002   moderate   Hyperlipidemia    Hypertension    Mini stroke 1998   no deficits   Sleep  apnea    does not use CPAP. PCP aware.    Past Surgical History   Past Surgical History:  Procedure Laterality Date   CATARACT EXTRACTION W/PHACO Left 12/31/2015   Procedure: CATARACT EXTRACTION PHACO AND INTRAOCULAR LENS PLACEMENT LEFT EYE; CDE: 17.28;  Surgeon: Cherene Mania, MD;  Location: [facility];  Service: Ophthalmology;  Laterality: Left;   CHOLECYSTECTOMY  1990   Dr Lorriane   COLONOSCOPY  01/23/01   Dr. Rosalie- small internal and external hemorrhoids, L sided mild diverticula. adenomatous polyp removed from rectum.   COLONOSCOPY  12/26/2011   Procedure: COLONOSCOPY;  Surgeon: Lamar CHRISTELLA Hollingshead, MD;  Location: [facility];  Service: Endoscopy;  Laterality: N/A;  8:30   ESOPHAGOGASTRODUODENOSCOPY  01/23/01   Dr. 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    Past Family History   Family History  Problem Relation Age of Onset   Hyperlipidemia Mother    Heart disease Mother    Hypertension Mother  Stroke Mother    Alcoholism Mother    Cirrhosis Mother    Heart disease Father        MI   Colon cancer Neg Hx     Past Social History   Social History   Socioeconomic History   Marital status: Widowed    Spouse name: Not on file   Number of children: 2   Years of education: Not on file   Highest education level: Not on file  Occupational History   Occupation: retired [employer]ing, [employer]    Employer: [employer]  Tobacco Use   Smoking status: Former    Current packs/day: 0.00    Average packs/day: 0.5 packs/day for 50.0 years (25.0 ttl pk-yrs)    Types: Cigarettes    Start date: 1968    Quit date: 2018    Years since quitting: 8.0   Smokeless tobacco: Never   Tobacco comments:    smokes about 4 cigarettes daily  Vaping Use   Vaping status: Not on file  Substance and Sexual Activity   Alcohol use: Not Currently    Comment: 2 beers daily x 40  yrs-rarely more   Drug use: No   Sexual activity: Never  Other Topics Concern   Not on file  Social History Narrative   Left message on machine for patient to call back.     Are you right handed or left handed? Right    Are you currently employed ?    What is your current occupation? retired   Do you live at home alone? no   Who lives with you? grand kids   What type of home do you live in: 1 story or 2 story? one   Caffeine 1 cup a day    Social Drivers of Health   Tobacco Use: Medium Risk (06/28/2024)   Patient History    Smoking Tobacco Use: Former    Smokeless Tobacco Use: Never    Passive Exposure: Not on Actuary Strain: Not on file  Food Insecurity: Not on file  Transportation Needs: Not on file  Physical Activity: Not on file  Stress: Not on file  Social Connections: Not on file  Intimate Partner Violence: Not on file  Depression (EYV7-0): Not on file  Alcohol Screen: Not on file  Housing: Not on file  Utilities: Not on file  Health Literacy: Not on file    Review of Systems   General: Negative for anorexia, weight loss, fever, chills, fatigue, weakness. Eyes: Negative for vision changes.  ENT: Negative for hoarseness, difficulty swallowing , nasal congestion. CV: Negative for chest pain, angina, palpitations, dyspnea on exertion, peripheral edema.  Respiratory: Negative for dyspnea at rest, dyspnea on exertion, cough, sputum, wheezing.  GI: See history of present illness. GU:  Negative for dysuria, hematuria, urinary incontinence, urinary frequency, nocturnal urination.  MS: Negative for joint pain. See hpi for back pain.  Derm: Negative for rash or itching.  Neuro: Negative for weakness, abnormal sensation, seizure, frequent headaches, memory loss,  confusion.  Psych: Negative for anxiety, depression, suicidal ideation, hallucinations.  Endo: Negative for unusual weight change.  Heme: Negative for bruising or bleeding. Allergy: Negative for  rash or hives.  Physical Exam   BP 112/66   Pulse 75   Temp 97.6 F (36.4 C) (Oral)   Ht 6' 1 (1.854 m)   Wt 199 lb 12.8 oz (90.6 kg)   SpO2 97%   BMI 26.36 kg/m  General: Well-nourished, well-developed in no acute distress. Accompanied by neighbor due to his hearing loss. She goes with him to all of his appointments.  Head: Normocephalic, atraumatic.   Eyes: Conjunctiva pink, no icterus. Mouth: Oropharyngeal mucosa moist and pink  Neck: Supple without thyromegaly, masses, or lymphadenopathy.  Lungs: Clear to auscultation bilaterally.  Heart: Regular rate and rhythm, no murmurs rubs or gallops.  Abdomen: Bowel sounds are normal, nontender, nondistended, no hepatosplenomegaly or masses,  no abdominal bruits or hernia, no rebound or guarding.  Tender with palpation of right anterior/lateral ribs and with palpation (even light touch) of right back/flank. Rectal: not performed Extremities: No lower extremity edema. No clubbing or deformities.  Neuro: Alert and oriented x 4 , grossly normal neurologically.  Skin: Warm and dry, no rash or jaundice.   Psych: Alert and cooperative, normal mood and affect.  Labs   See above  Imaging Studies   CT Angio Chest/Abd/Pel for Dissection W and/or Wo Contrast Result Date: 06/25/2024 CLINICAL DATA:  Acute aortic syndrome suspected. Back and chest pain with near syncopal episode. EXAM: CT ANGIOGRAPHY CHEST, ABDOMEN AND PELVIS TECHNIQUE: Non-contrast CT of the chest was initially obtained. Multidetector CT imaging through the chest, abdomen and pelvis was performed using the standard protocol during bolus administration of intravenous contrast. Multiplanar reconstructed images and MIPs were obtained and reviewed to evaluate the vascular anatomy. RADIATION DOSE REDUCTION: This exam was performed according to the departmental dose-optimization program which includes automated exposure control, adjustment of the mA and/or kV according to patient size  and/or use of iterative reconstruction technique. CONTRAST:  OMNIPAQUE  IOHEXOL  350 MG/ML SOLN COMPARISON:  CT abdomen/pelvis 05/28/2024, 09/28/2019 and CT chest 09/01/2023, 01/05/2022 FINDINGS: CTA CHEST FINDINGS Cardiovascular: Mild cardiomegaly. Calcified plaque over the left anterior descending and right coronary arteries. Thoracic aorta is normal in caliber. No evidence of aortic aneurysm or dissection. Calcified plaque throughout the thoracic aorta. Pulmonary arterial system is well opacified without evidence of emboli. Remaining vascular structures are unremarkable. Mediastinum/Nodes: No mediastinal or hilar adenopathy. Few small subcentimeter lymph nodes adjacent the distal esophagus. Lungs/Pleura: Lungs are adequately inflated. Biapical pleural thickening. Minimal bibasilar dependent atelectasis is present. Stable 6 mm nodule over the left apex as this is unchanged from August 2023 and therefore considered benign. No effusion. Airways are unremarkable. Musculoskeletal: No focal abnormality. Review of the MIP images confirms the above findings. CTA ABDOMEN AND PELVIS FINDINGS VASCULAR Aorta: Mild calcified plaque throughout the abdominal aorta without evidence of aneurysm or dissection. Celiac: Calcified plaque at its origin as the celiac artery is otherwise patent. SMA: Calcified plaque at its origin as the superior mesenteric artery and branches are otherwise patent. Renals: Calcified plaque at their origins as the renal arteries are otherwise patent. IMA: Origin from the abdominal aorta not visualized. IMA appears to be supplied from an SMA branch. Inflow: Calcified plaque throughout the iliac arteries without significant focal stenosis or occlusion. Veins: No obvious venous abnormality within the limitations of this arterial phase study. Review of the MIP images confirms the above findings. NON-VASCULAR Hepatobiliary: Prior cholecystectomy. Liver and biliary tree otherwise unremarkable. Pancreas: No  focal abnormality. Spleen: Absent. Adrenals/Urinary Tract: Adrenal glands are normal. Kidneys are normal in size without hydronephrosis or nephrolithiasis. Left renal cyst unchanged. Ureters and bladder are normal. Stomach/Bowel: Stomach and small bowel are normal. Appendix is normal. Significant diverticulosis throughout the colon without evidence of acute inflammation. Lymphatic: Stable 1.5 cm portacaval lymph node. Small subcentimeter periaortic lymph nodes unchanged. Otherwise, no significant adenopathy.  Reproductive: Prostate is unremarkable. Other: No free fluid or focal inflammatory change. Musculoskeletal: Degenerative changes with multilevel disc disease over the lumbar spine. No focal abnormality. Review of the MIP images confirms the above findings. IMPRESSION: 1. No acute findings in the chest, abdomen or pelvis. No evidence of aortic aneurysm or dissection. 2. Aortic atherosclerosis. Atherosclerotic coronary artery disease. 3. Stable 6 mm nodule over the left apex unchanged for greater than 2 years and therefore benign. 4. Colonic diverticulosis without evidence of acute inflammation. 5. Stable 1.5 cm portacaval lymph node. Aortic Atherosclerosis (ICD10-I70.0). Electronically Signed   By: Toribio Agreste M.D.   On: 06/25/2024 13:08   CT Head Wo Contrast Result Date: 06/25/2024 EXAM: CT HEAD WITHOUT CONTRAST 06/25/2024 11:59:29 AM TECHNIQUE: CT of the head was performed without the administration of intravenous contrast. Automated exposure control, iterative reconstruction, and/or weight based adjustment of the mA/kV was utilized to reduce the radiation dose to as low as reasonably achievable. COMPARISON: Brain MRI 01/26/2009, CT head 08/09/2021. CLINICAL HISTORY: 79 year old male. Head trauma, minor (Age >= 65y). Fall at rehab. FINDINGS: BRAIN AND VENTRICLES: No acute hemorrhage. No evidence of acute infarct. Small but circumscribed, chronic appearing lacunar infarct in the medial left thalamus (series  2 image 46). And similar chronic lacunar infarct of the right caudate is stable since 2023. Background brain volume within normal limits, and other gray white differentiation stable and within normal limits. No hydrocephalus. No extra-axial collection. No mass effect or midline shift. No suspicious intracranial vascular hyperdensity. Spine atherosclerosis at the skull base. ORBITS: No acute abnormality. SINUSES: Paranasal sinuses, tympanic cavities and mastoids remain well aerated. SOFT TISSUES AND SKULL: No acute soft tissue abnormality. No skull fracture. IMPRESSION: 1. No acute traumatic injury identified. 2. Milt to moderate for age chronic small vessel disease. Electronically signed by: Helayne Hurst MD 06/25/2024 12:31 PM EST RP Workstation: [id number]   CT Cervical Spine Wo Contrast Result Date: 06/25/2024 CLINICAL DATA:  Neck trauma.  Patient fell during cardiac rehab. EXAM: CT CERVICAL SPINE WITHOUT CONTRAST TECHNIQUE: Multidetector CT imaging of the cervical spine was performed without intravenous contrast. Multiplanar CT image reconstructions were also generated. RADIATION DOSE REDUCTION: This exam was performed according to the departmental dose-optimization program which includes automated exposure control, adjustment of the mA and/or kV according to patient size and/or use of iterative reconstruction technique. COMPARISON:  Cervical spine radiographs 11/25/2019. MRI cervical spine 10/06/2020. Chest CT 09/01/2023. FINDINGS: Technical note: Despite efforts by the technologist and patient, mild motion artifact is present on today's exam and could not be eliminated. This reduces exam sensitivity and specificity. Alignment: Normal. Skull base and vertebrae: No evidence of acute cervical spine fracture or traumatic subluxation. Soft tissues and spinal canal: No prevertebral fluid or swelling. No visible canal hematoma. Disc levels: Multilevel spondylosis with disc space narrowing, endplate osteophytes and  facet hypertrophy, similar to previous MRI. Mild spinal stenosis at multiple levels. Multilevel foraminal narrowing, worst on the left at C3-4 and C4-5. Upper chest: Mild scarring and pleural thickening at both lung apices, similar to previous chest CT. Additional chest findings dictated separately. Other: Bilateral carotid atherosclerosis. Small cervical lymph nodes are not pathologically enlarged. IMPRESSION: 1. No evidence of acute cervical spine fracture, traumatic subluxation or static signs of instability. 2. Multilevel cervical spondylosis as described. Electronically Signed   By: Elsie Perone M.D.   On: 06/25/2024 12:19   DG Chest Portable 1 View Result Date: 06/25/2024 EXAM: 1 VIEW(S) XRAY OF THE CHEST 06/25/2024 10:08:00 AM COMPARISON: 05/12/2022.  CLINICAL HISTORY: syncope, chest pain FINDINGS: LUNGS AND PLEURA: Chronic coarsened markings. Lower lung volumes. No pulmonary edema. No pleural effusion. No pneumothorax. HEART AND MEDIASTINUM: Aortic calcification. No acute abnormality of the cardiac and mediastinal silhouettes. BONES AND SOFT TISSUES: No acute osseous abnormality. IMPRESSION: 1. No acute findings. 2. Lower lung volumes. Electronically signed by: Katheleen Faes MD 06/25/2024 10:31 AM EST RP Workstation: [id number]   MR LUMBAR SPINE WO CONTRAST Result Date: 06/16/2024 MR LUMBAR SPINE WITHOUT CONTRAST HISTORY: Lower back pain TECHNIQUE: Multiplanar, multi-sequence imaging of the lumbar spine was performed without contrast. COMPARISON: none FINDINGS: There is moderate left scoliosis of the lumbar spine. There is 3 mm retrolisthesis at L1-2 with moderate disc height loss. There is Modic 2 fatty change along the posterior endplates. There is 2 mm retrolisthesis at L2-3 with moderate Modic 1 edema along the opposing endplates and moderate disc height loss. There is moderate disc height loss L2-3, L3-4, and L4-5. There is Modic 2 fatty change along the opposing endplates at these levels and  there is Modic 1 edema along the opposing endplates at L5-S1. No fracture or vertebral body height loss is seen. There is disc desiccation throughout the lumbar spine. Conus terminates normally at L1. There is a left renal cyst for which specific imaging follow-up is not required. There is mild subcutaneous edema within the posterior soft tissues of lower back. T12-L1:  No disc bulge, central canal stenosis, or neuroforaminal stenosis. L1-2: Circumferential disc bulge causes mild effacement of the anterior thecal sac without significant canal or foraminal stenosis. Mild bilateral facet arthropathy with bilateral facet joint effusions. L2-3: Circumferential disc bulge causes mild effacement of the anterior thecal sac. No significant canal or foraminal stenosis. Moderate bilateral facet arthropathy. L3-4: Circumferential disc bulging with ligamentum flavum hypertrophy causes mild central canal stenosis and mild right neuroforaminal stenosis. Severe bilateral facet arthropathy with right-sided facet joint effusion. L4-5: Circumferential disc bulge with ligamentum flavum hypertrophy causes mild central canal stenosis and mild bilateral neural foraminal stenosis. Posterior annular high intensity zone. Moderate bilateral facet arthropathy. L5-S1: Circumferential disc bulge without significant canal or foraminal stenosis. Severe left facet arthropathy and mild right facet arthropathy. 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 Electronically signed by: Venetia Neer MD 06/16/2024 07:14 AM EST RP Workstation: [id number]    Assessment/Plan:    Assessment & Plan Diverticulitis   Recent ED visit 05/28/2024 with CT documented abnormal descending colon suspicious for diverticulitis. Treated with 7 day course of Augmentin  with resolution of abdominal pain/bloating. Last colonoscopy in 2018. Was due for surveillance in 2023 for history of polyps. Discussed need to  consider colonoscopy to evaluate abnormal CT, to exclude neoplasia or other inflammation causes.  - colonoscopy in the near future. ASA 3.  I have discussed the risks, alternatives, benefits with regards to but not limited to the risk of reaction to medication, bleeding, infection, perforation and the patient is agreeable to proceed. Written consent to be obtained. -given recent syncopal episode in cardiac rehab (per patient/EDP syncope), suspected to be due to hypotension, we will request cardiac clearance.    Right back/flank pain -unrelated to meals and BMs -suspect back and/or musculoskeletal related -Advised follow-up with Dr. Margrette for back pain as scheduled.  Fatty liver disease Noted on recent U/S imaging, unremarkable liver on CT.  Previous modest daily etoh use for over 40 years. 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  -FIB 4 0.91, advanced fibrosis excluded.  -SAFE 27, risk  of moderate F2 fibrosis -Low risk of progression, no pharmacologic intervention needed at this time -Advised adherence to a healthy diet and abstinence from alcohol. -Recommended CBC, CMET, ELF in six months.      Sonny RAMAN. Ezzard, MHS, PA-C [facility]  "

## 2024-06-28 NOTE — Telephone Encounter (Signed)
 Called pt, line rang numerous times, no answer and no VM Needs TCS with Dr. Shaaron, ASA 3

## 2024-06-28 NOTE — Telephone Encounter (Signed)
 Andre [name], patient had possible syncopal episode while in cardiac rehab within past one week. Seen in ED. Suspected related to hypotension. Has seen cardiology recently but not since this episode. Let's request cardiac clearance before colonoscopy.

## 2024-07-02 NOTE — Telephone Encounter (Signed)
" °  Request for patient to stop medication prior to procedure or is needing cleareance  07/02/24  Andre [name] [birth year]  What type of surgery is being performed? COLONOSCOPY  When is surgery scheduled? TBD  What type of clearance is required (medical or pharmacy to hold medication or both? CARDIAC  Are there any medications that need to be held prior to surgery and how long? N/A  Name of physician performing surgery?  Dr. Shaaron [facility] Phone: [phone], option 5 Fax: [phone]  Anesthesia type (none, local, MAC, general)? MAC    "

## 2024-07-02 NOTE — Telephone Encounter (Signed)
 Pt seen by Almarie Crate, NP 06/14/24. Will fwd to provider to see if she can provide clearance for procedure.  Glendia Ferrier, PA-C    07/02/2024 3:25 PM

## 2024-07-04 NOTE — Telephone Encounter (Signed)
 DPR ok to s/w the pt's friend Bruna Africa who scheduled in office appt 07/10/24 with Dr. Alvan in the [place] office for preop clearance.

## 2024-07-04 NOTE — Telephone Encounter (Signed)
" ° ° °  Primary Cardiologist:Branch, Dorn, MD  Chart reviewed as part of pre-operative protocol coverage. Because of Andre [name]'s past medical history and time since last visit, he/she will require a follow-up visit in order to better assess preoperative cardiovascular risk.  Pre-op covering staff: - Please schedule in office appointment and call patient to inform them. - Please contact requesting surgeon's office via preferred method (i.e, phone, fax) to inform them of need for appointment prior to surgery.  If applicable, this message will also be routed to pharmacy pool and/or primary cardiologist for input on holding anticoagulant/antiplatelet agent as requested below so that this information is available at time of patient's appointment.   Andre CHRISTELLA Beauvais, NP  07/04/2024, 9:49 AM   "

## 2024-07-05 ENCOUNTER — Ambulatory Visit: Admitting: Orthopedic Surgery

## 2024-07-05 ENCOUNTER — Encounter: Payer: Self-pay | Admitting: Orthopedic Surgery

## 2024-07-05 VITALS — BP 112/66 | Ht 73.0 in | Wt 199.0 lb

## 2024-07-05 DIAGNOSIS — M48061 Spinal stenosis, lumbar region without neurogenic claudication: Secondary | ICD-10-CM

## 2024-07-05 MED ORDER — TIZANIDINE HCL 4 MG PO TABS: 4.0000 mg | ORAL_TABLET | Freq: Every day | ORAL | 1 refills | Status: AC

## 2024-07-05 MED ORDER — GABAPENTIN 100 MG PO CAPS: 100.0000 mg | ORAL_CAPSULE | Freq: Three times a day (TID) | ORAL | 2 refills | Status: AC

## 2024-07-05 NOTE — Progress Notes (Signed)
 "  Office Visit Note   Patient: Andre [name]           Date of Birth: [birth year]           MRN: [id number] Visit Date: 07/05/2024 Requested by: Jolee Eudora HERO, FNP [phone] [address] [place] [address] [place],  [place] [address] PCP: [name], MD   Assessment & Plan:   79 year old male spinal stenosis previously seen 3 years ago now comes back with 3 months of signs and symptoms and physical exam findings consistent with spinal stenosis  The patient needs a referral to neurosurgery or orthospine  I did start him on the medications listed below  I showed him a model of the spine I explained to him the findings on MRI he seemed to understand the findings along with his wife.  He also understood the treatment options available and the reason for referral  Encounter Diagnosis  Name Primary?   Spinal stenosis of lumbar region without neurogenic claudication Yes    Meds ordered this encounter  Medications   gabapentin  (NEURONTIN ) 100 MG capsule    Sig: Take 1 capsule (100 mg total) by mouth 3 (three) times daily.    Dispense:  90 capsule    Refill:  2   tiZANidine  (ZANAFLEX ) 4 MG tablet    Sig: Take 1 tablet (4 mg total) by mouth daily.    Dispense:  30 tablet    Refill:  1    I also recommend Tylenol  500 mg Q6 and Celebrex daily   Subjective: Chief Complaint  Patient presents with   Back Pain    Right sided low back/ does not go into leg s     HPI: 79 year old male presents with 44-month history of lower back pain actually history goes back further than that but in any event he has had physical therapy he has been on Celebrex takes tramadol  for pain in his back he got some relief from physical therapy 2 or 3 years ago but the pain is come back  He complains of pain from anywhere 3-10 out of 10 worse with standing occasionally radiating to the right thigh but not below  the knee              ROS: Cardiac history currently no chest pain   Images personally read and my  interpretation : Patient's MRI shows multilevel disc disease with spinal stenosis at varying levels this includes disc bulges foraminal stenosis central stenosis  Visit Diagnoses:  1. Spinal stenosis of lumbar region without neurogenic claudication      Follow-Up Instructions: Return for REFERRAL.    Objective: Vital Signs: BP 112/66 Comment: 06/28/24  Ht 6' 1 (1.854 m)   Wt 199 lb (90.3 kg)   BMI 26.25 kg/m   Physical Exam  Constitutional vital signs as recorded General Appearance patient has a kyphosis in his thoracic spine otherwise fairly ectomorphic in terms of body habitus  Cardiovascular no swelling or varicose studies of significance pulses and temperature normal no edema or tenderness  Skin he has some age-related skin changes but nothing significant.  No major scars rashes lesions ulcers on his extremities  His fine motor coordination heel-to-shin is okay reflexes are 2+ at the knee bilaterally and 0-1 at the ankle.  Sensation was normal except for L4 on the right  He was alert and oriented x 3 with no depression anxiety or agitation  His gait showed a slumped over shuffling gait  He had tenderness  on the right side of his lower back and also in the midline but none on the left.  He has some mild tenderness that went into the buttock and right side of his leg  He had negative straight leg raises for radicular symptoms just pain and tight hamstrings    Specialty Comments:  No specialty comments available.  Imaging: No results found.   PMFS History: Patient Active Problem List   Diagnosis Date Noted   Abnormal CT scan, colon 06/28/2024   Fatty liver 06/28/2024   Diverticulitis 06/05/2024   Migraine 03/20/2024   Hypomagnesemia 08/23/2023   Low back pain 08/23/2023   Arthralgia of left ankle 01/04/2023   Chronic hypotension 12/05/2022   Complication of diabetes mellitus (HCC) 12/05/2022   Fatigue 08/01/2022   Hardening of the aorta (main artery of the  heart) 07/31/2022   Cervical lymphadenopathy 07/08/2022   Dysphagia 07/08/2022   Bilateral hearing loss 07/08/2021   Pain of left lower extremity 04/12/2021   Abnormal renal function 11/18/2020   Abnormal results of liver function studies 11/18/2020   DDD (degenerative disc disease), cervical 11/18/2020   Leukocytosis 10/01/2019   Diarrhea in adult patient 10/01/2019   SBO (small bowel obstruction) (HCC) 09/28/2019   AKI (acute kidney injury) 09/28/2019   Dehydration 09/28/2019   Emesis, persistent 09/28/2019   Memory change 11/01/2012   OSA (obstructive sleep apnea) 05/02/2012   H/O adenomatous polyp of colon 12/02/2011   Routine general medical examination at a health care facility 11/04/2011   Decreased visual acuity 11/04/2011   Hyperlipidemia 11/04/2011   Tobacco use 09/21/2011   History of TIAs 09/20/2011   COPD (chronic obstructive pulmonary disease) (HCC) 09/20/2011   Insomnia 09/20/2011   Colon polyp 09/20/2011   Past Medical History:  Diagnosis Date   Adenomatous polyp 2002   tcs by Dr. Rosalie   Diabetes mellitus without complication [facility])    Diverticula of colon 2002   L side   GERD (gastroesophageal reflux disease)    Hemorrhoids 2002   internal and external   Hiatal hernia 2002   moderate   Hyperlipidemia    Hypertension    Mini stroke 1998   no deficits   Sleep apnea    does not use CPAP. PCP aware.    Family History  Problem Relation Age of Onset   Hyperlipidemia Mother    Heart disease Mother    Hypertension Mother    Stroke Mother    Alcoholism Mother    Cirrhosis Mother    Heart disease Father        MI   Colon cancer Neg Hx     Past Surgical History:  Procedure Laterality Date   CATARACT EXTRACTION W/PHACO Left 12/31/2015   Procedure: CATARACT EXTRACTION PHACO AND INTRAOCULAR LENS PLACEMENT LEFT EYE; CDE: 17.28;  Surgeon: Cherene Mania, MD;  Location: [facility];  Service: Ophthalmology;  Laterality: Left;   CHOLECYSTECTOMY  1990   Dr Lorriane    COLONOSCOPY  01/23/01   Dr. Rosalie- small internal and external hemorrhoids, L sided mild diverticula. adenomatous polyp removed from rectum.   COLONOSCOPY  12/26/2011   Procedure: COLONOSCOPY;  Surgeon: Lamar CHRISTELLA Hollingshead, MD;  Location: [facility];  Service: Endoscopy;  Laterality: N/A;  8:30   ESOPHAGOGASTRODUODENOSCOPY  01/23/01   Dr. 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

## 2024-07-05 NOTE — Progress Notes (Signed)
 SABRA

## 2024-07-05 NOTE — Progress Notes (Signed)
" °  Intake history:  Chief Complaint  Patient presents with   Back Pain    Right sided low back/ does not go into leg s      BP 112/66 Comment: 06/28/24  Ht 6' 1 (1.854 m)   Wt 199 lb (90.3 kg)   BMI 26.25 kg/m  Body mass index is 26.25 kg/m.  Pharmacy? __[facility]____________________________________  WHAT ARE WE SEEING YOU FOR TODAY?   Back pain   How long has this bothered you? (DOI?DOS?WS?)  approximately 3-4 month(s) ago  Was there an injury? No  Anticoag.  No   Any ALLERGIES _________Allergies[1] _____________________________________   Treatment:  Have you taken:  Tylenol  Yes  Advil No  Had PT Yes went for 6 weeks, helped then pain came back   Had injection No  Other  ___has had MRI ______________________        [1] No Known Allergies  "

## 2024-07-05 NOTE — Patient Instructions (Addendum)
 We are referring you to [facility] from [employer] address is [address] [place] The phone number is [phone]  The office will call you with an appointment Dr. Georgina  For pain   Take tylenol  500 mg every 6 hrs   Take celebrex daily   Meds ordered this encounter  Medications   gabapentin  (NEURONTIN ) 100 MG capsule    Sig: Take 1 capsule (100 mg total) by mouth 3 (three) times daily.    Dispense:  90 capsule    Refill:  2   tiZANidine  (ZANAFLEX ) 4 MG tablet    Sig: Take 1 tablet (4 mg total) by mouth daily.    Dispense:  30 tablet    Refill:  1   Take tramadol  if pain goes over a 5/10

## 2024-07-10 ENCOUNTER — Encounter: Payer: Self-pay | Admitting: Cardiology

## 2024-07-10 ENCOUNTER — Ambulatory Visit: Admitting: Cardiology

## 2024-07-10 VITALS — BP 118/72 | HR 71 | Ht 73.0 in | Wt 201.2 lb

## 2024-07-10 DIAGNOSIS — R55 Syncope and collapse: Secondary | ICD-10-CM | POA: Diagnosis not present

## 2024-07-10 DIAGNOSIS — I951 Orthostatic hypotension: Secondary | ICD-10-CM

## 2024-07-10 NOTE — Progress Notes (Signed)
 "     Clinical Summary Andre [name] is a 79 y.o.male seen today as a focused visit for recent episode of syncope.  .Orthostatic hypotension - prior history of orthostatic hypotension - ER visit Jan 20 with syncope at cardiac rehab - bp was in the 80s when patient initially reported feeling lightheaded.  - had some diarrhea over the last few days leading up to episode, as well as poor oral intake.  - bp improved with fluids in the ER.  - he is on zanaflex . Ran out of midodrine  for about 10 days, took losartan    2.Preoperative evaluation - needs colonscopy, needing cardiac clearance.    Other medical problems not addressed this visit  1.Chest pain  - across entire chest, aching pain 7/10 in severity. Can occur at rest or with activity - can have some associated nausea - pain lasts a few minutes, longest episodes 20-30 minutes - some DOE with activities x 6 months - not specific positional    CAD risk factors: HTN, HL, DM2, former smoker x 50 years.         12/2021 nuclear stress: no ischemia 07/2023 nuclear stress: no ischemia   - interested in cardiac/pulm rehab for exercise, his neihbor attends the maintence. Discussed will have to discuss with staff there once stress results are back     3.HLD   4. History of TIAs  5. Hyperkalemia     Past Medical History:  Diagnosis Date   Adenomatous polyp 2002   tcs by Dr. Rosalie   Diabetes mellitus without complication [facility])    Diverticula of colon 2002   L side   GERD (gastroesophageal reflux disease)    Hemorrhoids 2002   internal and external   Hiatal hernia 2002   moderate   Hyperlipidemia    Hypertension    Mini stroke 1998   no deficits   Sleep apnea    does not use CPAP. PCP aware.     Allergies[1]   Current Outpatient Medications  Medication Sig Dispense Refill   aspirin  325 MG tablet Take 325 mg by mouth daily.     atorvastatin  (LIPITOR) 40 MG tablet Take 40 mg by mouth daily.     celecoxib  (CELEBREX) 200 MG capsule Take 200 mg by mouth daily.     diazepam (VALIUM) 5 MG tablet Take 5 mg by mouth as needed.     DULoxetine  (CYMBALTA ) 30 MG capsule Take 1 capsule (30 mg total) by mouth daily. 30 capsule 5   gabapentin  (NEURONTIN ) 100 MG capsule Take 1 capsule (100 mg total) by mouth 3 (three) times daily. 90 capsule 2   JANUVIA 100 MG tablet Take 100 mg by mouth daily.     levothyroxine (SYNTHROID) 50 MCG tablet Take 50 mcg by mouth daily.     metFORMIN (GLUCOPHAGE) 500 MG tablet Take 500 mg by mouth 2 (two) times daily. (Patient taking differently: Take 500 mg by mouth 3 (three) times daily.)     midodrine  (PROAMATINE ) 5 MG tablet take 1 tablet twice daily with a meal 180 tablet 3   nitroGLYCERIN  (NITROSTAT ) 0.4 MG SL tablet Place 1 tablet (0.4 mg total) under the tongue every 5 (five) minutes as needed for chest pain. 25 tablet 3   omeprazole  (PRILOSEC) 20 MG capsule TAKE 1 CAPSULE BY MOUTH ONCE A DAY. 30 capsule 0   ranolazine  (RANEXA ) 500 MG 12 hr tablet Take 1 tablet (500 mg total) by mouth 2 (two) times daily. 60 tablet 5  tiZANidine  (ZANAFLEX ) 4 MG tablet Take 1 tablet (4 mg total) by mouth daily. 30 tablet 1   traMADol  (ULTRAM ) 50 MG tablet Take 1 tablet (50 mg total) by mouth every 6 (six) hours as needed. 20 tablet 0   No current facility-administered medications for this visit.     Past Surgical History:  Procedure Laterality Date   CATARACT EXTRACTION W/PHACO Left 12/31/2015   Procedure: CATARACT EXTRACTION PHACO AND INTRAOCULAR LENS PLACEMENT LEFT EYE; CDE: 17.28;  Surgeon: Cherene Mania, MD;  Location: [facility];  Service: Ophthalmology;  Laterality: Left;   CHOLECYSTECTOMY  1990   Dr Lorriane   COLONOSCOPY  01/23/01   Dr. Rosalie- small internal and external hemorrhoids, L sided mild diverticula. adenomatous polyp removed from rectum.   COLONOSCOPY  12/26/2011   Procedure: COLONOSCOPY;  Surgeon: Lamar CHRISTELLA Hollingshead, MD;  Location: [facility];  Service: Endoscopy;   Laterality: N/A;  8:30   ESOPHAGOGASTRODUODENOSCOPY  01/23/01   Dr. 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 His smoking use included cigarettes. He started smoking about 58 years ago. He has a 25 pack-year smoking history. He has never used smokeless tobacco. Andre [name] reports that he does not currently use alcohol.     Physical Examination Today's Vitals   07/10/24 1329  BP: 118/72  Pulse: 71  SpO2: 96%  Weight: 201 lb 3.2 oz (91.3 kg)  Height: 6' 1 (1.854 m)   Body mass index is 26.55 kg/m.  Gen: resting comfortably, no acute distress HEENT: no scleral icterus, pupils equal round and reactive, no palptable cervical adenopathy,  CV: RRR, no mrg, no jvd Resp: Clear to auscultation bilaterally GI: abdomen is soft, non-tender, non-distended, normal bowel sounds, no hepatosplenomegaly MSK: extremities are warm, no edema.  Skin: warm, no rash Neuro:  no focal deficits Psych: appropriate affect   Diagnostic Studies 12/2021 nuclear stress  Lexiscan  stress is electrically negative for ischemia   Myoview  scan shows normal perfusion   No ischemia or scar   LVEF calculated at 70%   Overall low risk study  07/2023 nuclear stress Findings are consistent with no ischemia. The study is low risk.   No ST deviation was noted. The ECG was negative for ischemia.   LV perfusion is normal.  No significant myocardial perfusion defects to indicate scar or ischemia in the setting of diaphragmatic attenuation.   Left ventricular function is  normal. Nuclear stress EF: 56%.  07/2023 echo 1. Left ventricular ejection fraction, by estimation, is 55 to 60%. The  left ventricle has normal function. The left ventricle has no regional  wall motion abnormalities. There is mild left ventricular hypertrophy.  Left ventricular diastolic parameters  are consistent with Grade I diastolic dysfunction (impaired relaxation).  The global longitudinal strain is indeterminate.   2. Right ventricular systolic function is normal. The right ventricular  size is normal. Tricuspid regurgitation signal is  inadequate for assessing  PA pressure.   3. The mitral valve is normal in structure. No evidence of mitral valve  regurgitation. No evidence of mitral stenosis.   4. The aortic valve is tricuspid. Aortic valve regurgitation is mild. No  aortic stenosis is present.    Assessment and Plan  1.Orthostatic hypotension/syncope - history of orthostatic hypotension, has been on midodrine  - recent syncopal episodes. At the time had run out of midodrine  for 10 days. He decided to restart some losartan  he had at home as he thought midodrine  was a bp to lower his bp and was going to use losartan  as a substitute until he got his midodrine  back. Also some limited oral hydration, diarrhea at the time - off losartan , back on midodrine . No recurrent symptoms. Discussed staying well hydrated - no further workup is indicated - ok to resume cardiac rehab, ok to proceed with colonscopy  2.Preoperative evaluation - plans for colonscopy - no contraindication from cardiac stanpdoint. We discussed staying well hydrated while undergoing his colon prep    F/u 6 months  Andre [name], M.D.     [1] No Known Allergies [2] No Known Allergies  "

## 2024-07-10 NOTE — Patient Instructions (Addendum)
 Medication Instructions:  Continue all current medications.   Labwork: none  Testing/Procedures: none  Follow-Up: 6 months   Any Other Special Instructions Will Be Listed Below (If Applicable).   If you need a refill on your cardiac medications before your next appointment, please call your pharmacy.

## 2024-07-11 ENCOUNTER — Telehealth (HOSPITAL_COMMUNITY): Payer: Self-pay | Admitting: *Deleted

## 2024-07-11 NOTE — Telephone Encounter (Signed)
-----   Message from Dorn Ross, MD sent at 07/11/2024  1:05 PM EST ----- He is ok to resume cardiac rehab.    JINNY Ross MD

## 2024-08-26 ENCOUNTER — Ambulatory Visit: Admitting: Orthopedic Surgery
